# Patient Record
Sex: Female | Born: 1965 | Hispanic: No | Marital: Married | Smoking: Never smoker
Health system: Southern US, Community
[De-identification: ages and names within clinical notes are randomized; demographics above are authoritative.]

## PROBLEM LIST (undated history)

## (undated) DIAGNOSIS — R51 Headache: Secondary | ICD-10-CM

## (undated) DIAGNOSIS — E785 Hyperlipidemia, unspecified: Secondary | ICD-10-CM

## (undated) DIAGNOSIS — Z923 Personal history of irradiation: Secondary | ICD-10-CM

## (undated) DIAGNOSIS — K219 Gastro-esophageal reflux disease without esophagitis: Secondary | ICD-10-CM

## (undated) DIAGNOSIS — C50919 Malignant neoplasm of unspecified site of unspecified female breast: Secondary | ICD-10-CM

## (undated) DIAGNOSIS — Z91018 Allergy to other foods: Secondary | ICD-10-CM

## (undated) DIAGNOSIS — C50211 Malignant neoplasm of upper-inner quadrant of right female breast: Principal | ICD-10-CM

## (undated) HISTORY — DX: Allergy to other foods: Z91.018

## (undated) HISTORY — DX: Malignant neoplasm of unspecified site of unspecified female breast: C50.919

## (undated) HISTORY — DX: Gastro-esophageal reflux disease without esophagitis: K21.9

## (undated) HISTORY — DX: Malignant neoplasm of upper-inner quadrant of right female breast: C50.211

## (undated) HISTORY — DX: Hyperlipidemia, unspecified: E78.5

## (undated) HISTORY — DX: Headache: R51

---

## 1997-08-18 ENCOUNTER — Encounter: Payer: Self-pay | Admitting: Internal Medicine

## 1997-09-03 ENCOUNTER — Encounter (HOSPITAL_COMMUNITY): Admission: RE | Admit: 1997-09-03 | Discharge: 1997-12-02 | Payer: Self-pay | Admitting: Obstetrics and Gynecology

## 1997-10-21 ENCOUNTER — Other Ambulatory Visit: Admission: RE | Admit: 1997-10-21 | Discharge: 1997-10-21 | Payer: Self-pay | Admitting: Obstetrics and Gynecology

## 1998-05-05 ENCOUNTER — Inpatient Hospital Stay (HOSPITAL_COMMUNITY): Admission: AD | Admit: 1998-05-05 | Discharge: 1998-05-10 | Payer: Self-pay | Admitting: Obstetrics and Gynecology

## 1998-05-10 ENCOUNTER — Encounter (HOSPITAL_COMMUNITY): Admission: RE | Admit: 1998-05-10 | Discharge: 1998-08-08 | Payer: Self-pay | Admitting: *Deleted

## 1999-06-30 ENCOUNTER — Other Ambulatory Visit: Admission: RE | Admit: 1999-06-30 | Discharge: 1999-06-30 | Payer: Self-pay | Admitting: Obstetrics and Gynecology

## 2000-10-21 ENCOUNTER — Encounter: Payer: Self-pay | Admitting: Infectious Diseases

## 2000-10-21 ENCOUNTER — Ambulatory Visit (HOSPITAL_COMMUNITY): Admission: RE | Admit: 2000-10-21 | Discharge: 2000-10-21 | Payer: Self-pay | Admitting: Infectious Diseases

## 2000-11-14 ENCOUNTER — Other Ambulatory Visit: Admission: RE | Admit: 2000-11-14 | Discharge: 2000-11-14 | Payer: Self-pay | Admitting: Obstetrics and Gynecology

## 2001-02-20 ENCOUNTER — Encounter: Payer: Self-pay | Admitting: Orthopedic Surgery

## 2001-02-20 ENCOUNTER — Ambulatory Visit (HOSPITAL_COMMUNITY): Admission: RE | Admit: 2001-02-20 | Discharge: 2001-02-20 | Payer: Self-pay | Admitting: Orthopedic Surgery

## 2001-04-22 ENCOUNTER — Encounter: Admission: RE | Admit: 2001-04-22 | Discharge: 2001-07-21 | Payer: Self-pay | Admitting: Internal Medicine

## 2001-06-03 ENCOUNTER — Encounter: Payer: Self-pay | Admitting: Family Medicine

## 2001-06-03 ENCOUNTER — Encounter: Admission: RE | Admit: 2001-06-03 | Discharge: 2001-06-03 | Payer: Self-pay | Admitting: Family Medicine

## 2002-01-16 ENCOUNTER — Other Ambulatory Visit: Admission: RE | Admit: 2002-01-16 | Discharge: 2002-01-16 | Payer: Self-pay | Admitting: Obstetrics and Gynecology

## 2002-01-27 ENCOUNTER — Ambulatory Visit (HOSPITAL_COMMUNITY): Admission: RE | Admit: 2002-01-27 | Discharge: 2002-01-27 | Payer: Self-pay | Admitting: Obstetrics and Gynecology

## 2002-01-27 ENCOUNTER — Encounter: Payer: Self-pay | Admitting: Obstetrics and Gynecology

## 2002-10-08 ENCOUNTER — Encounter: Payer: Self-pay | Admitting: Obstetrics and Gynecology

## 2002-10-08 ENCOUNTER — Inpatient Hospital Stay (HOSPITAL_COMMUNITY): Admission: AD | Admit: 2002-10-08 | Discharge: 2002-10-08 | Payer: Self-pay | Admitting: Obstetrics and Gynecology

## 2002-10-16 ENCOUNTER — Inpatient Hospital Stay (HOSPITAL_COMMUNITY): Admission: AD | Admit: 2002-10-16 | Discharge: 2002-10-16 | Payer: Self-pay | Admitting: Obstetrics and Gynecology

## 2002-11-27 ENCOUNTER — Inpatient Hospital Stay (HOSPITAL_COMMUNITY): Admission: AD | Admit: 2002-11-27 | Discharge: 2002-11-30 | Payer: Self-pay | Admitting: Obstetrics and Gynecology

## 2002-12-31 ENCOUNTER — Other Ambulatory Visit: Admission: RE | Admit: 2002-12-31 | Discharge: 2002-12-31 | Payer: Self-pay | Admitting: Obstetrics and Gynecology

## 2004-07-20 ENCOUNTER — Other Ambulatory Visit: Admission: RE | Admit: 2004-07-20 | Discharge: 2004-07-20 | Payer: Self-pay | Admitting: Obstetrics and Gynecology

## 2004-08-01 ENCOUNTER — Ambulatory Visit: Payer: Self-pay | Admitting: Cardiology

## 2004-11-24 ENCOUNTER — Encounter: Payer: Self-pay | Admitting: Internal Medicine

## 2004-11-26 ENCOUNTER — Encounter: Payer: Self-pay | Admitting: Internal Medicine

## 2007-01-02 ENCOUNTER — Ambulatory Visit: Payer: Self-pay | Admitting: Internal Medicine

## 2007-01-02 ENCOUNTER — Encounter: Payer: Self-pay | Admitting: Internal Medicine

## 2007-01-02 DIAGNOSIS — K219 Gastro-esophageal reflux disease without esophagitis: Secondary | ICD-10-CM | POA: Insufficient documentation

## 2007-01-02 DIAGNOSIS — R0989 Other specified symptoms and signs involving the circulatory and respiratory systems: Secondary | ICD-10-CM

## 2007-01-02 DIAGNOSIS — J309 Allergic rhinitis, unspecified: Secondary | ICD-10-CM | POA: Insufficient documentation

## 2007-01-02 DIAGNOSIS — R0609 Other forms of dyspnea: Secondary | ICD-10-CM | POA: Insufficient documentation

## 2007-01-02 DIAGNOSIS — E785 Hyperlipidemia, unspecified: Secondary | ICD-10-CM | POA: Insufficient documentation

## 2007-01-02 DIAGNOSIS — R4589 Other symptoms and signs involving emotional state: Secondary | ICD-10-CM | POA: Insufficient documentation

## 2007-01-02 LAB — CONVERTED CEMR LAB
AST: 22 units/L (ref 0–37)
Basophils Absolute: 0 10*3/uL (ref 0.0–0.1)
CO2: 27 meq/L (ref 19–32)
Calcium: 9.8 mg/dL (ref 8.4–10.5)
Chloride: 106 meq/L (ref 96–112)
Cholesterol: 174 mg/dL (ref 0–200)
Creatinine, Ser: 0.7 mg/dL (ref 0.4–1.2)
GFR calc Af Amer: 119 mL/min
HCT: 38.6 % (ref 36.0–46.0)
Ketones, ur: NEGATIVE mg/dL
Lymphocytes Relative: 31.5 % (ref 12.0–46.0)
MCHC: 34.7 g/dL (ref 30.0–36.0)
MCV: 91.4 fL (ref 78.0–100.0)
Monocytes Absolute: 0.4 10*3/uL (ref 0.2–0.7)
Monocytes Relative: 6.5 % (ref 3.0–11.0)
Neutro Abs: 3.5 10*3/uL (ref 1.4–7.7)
Platelets: 300 10*3/uL (ref 150–400)
RDW: 12.2 % (ref 11.5–14.6)
Specific Gravity, Urine: >=1.03 (ref 1.000–1.03)
TSH: 1.36 microintl units/mL (ref 0.35–5.50)
Urine Glucose: NEGATIVE mg/dL
Urobilinogen, UA: 0.2 (ref 0.0–1.0)
VLDL: 20 mg/dL (ref 0–40)
pH: 5.5 (ref 5.0–8.0)

## 2007-01-28 ENCOUNTER — Encounter: Admission: RE | Admit: 2007-01-28 | Discharge: 2007-01-28 | Payer: Self-pay | Admitting: Obstetrics and Gynecology

## 2007-03-18 ENCOUNTER — Encounter: Payer: Self-pay | Admitting: Internal Medicine

## 2007-04-15 ENCOUNTER — Encounter: Payer: Self-pay | Admitting: Internal Medicine

## 2007-05-14 ENCOUNTER — Encounter: Payer: Self-pay | Admitting: Internal Medicine

## 2007-06-10 ENCOUNTER — Encounter: Payer: Self-pay | Admitting: Internal Medicine

## 2007-07-21 ENCOUNTER — Ambulatory Visit: Payer: Self-pay | Admitting: Internal Medicine

## 2007-07-21 DIAGNOSIS — R12 Heartburn: Secondary | ICD-10-CM | POA: Insufficient documentation

## 2007-07-21 DIAGNOSIS — R141 Gas pain: Secondary | ICD-10-CM | POA: Insufficient documentation

## 2007-07-21 DIAGNOSIS — R1031 Right lower quadrant pain: Secondary | ICD-10-CM | POA: Insufficient documentation

## 2007-07-21 DIAGNOSIS — R142 Eructation: Secondary | ICD-10-CM

## 2007-07-21 DIAGNOSIS — R143 Flatulence: Secondary | ICD-10-CM

## 2007-07-30 ENCOUNTER — Encounter: Admission: RE | Admit: 2007-07-30 | Discharge: 2007-09-02 | Payer: Self-pay | Admitting: Occupational Medicine

## 2007-08-05 ENCOUNTER — Telehealth: Payer: Self-pay | Admitting: Internal Medicine

## 2007-08-08 ENCOUNTER — Telehealth (INDEPENDENT_AMBULATORY_CARE_PROVIDER_SITE_OTHER): Payer: Self-pay

## 2007-08-08 ENCOUNTER — Ambulatory Visit (HOSPITAL_COMMUNITY): Admission: RE | Admit: 2007-08-08 | Discharge: 2007-08-08 | Payer: Self-pay | Admitting: Internal Medicine

## 2007-08-11 ENCOUNTER — Ambulatory Visit (HOSPITAL_COMMUNITY): Admission: RE | Admit: 2007-08-11 | Discharge: 2007-08-11 | Payer: Self-pay | Admitting: Internal Medicine

## 2007-08-15 ENCOUNTER — Telehealth: Payer: Self-pay | Admitting: Internal Medicine

## 2007-08-26 ENCOUNTER — Encounter: Payer: Self-pay | Admitting: Internal Medicine

## 2007-12-15 ENCOUNTER — Telehealth: Payer: Self-pay | Admitting: Internal Medicine

## 2008-03-19 ENCOUNTER — Encounter: Admission: RE | Admit: 2008-03-19 | Discharge: 2008-03-19 | Payer: Self-pay | Admitting: Obstetrics and Gynecology

## 2008-06-21 ENCOUNTER — Ambulatory Visit: Payer: Self-pay | Admitting: Surgery

## 2009-10-24 ENCOUNTER — Encounter: Admission: RE | Admit: 2009-10-24 | Discharge: 2009-10-24 | Payer: Self-pay | Admitting: Chiropractic Medicine

## 2009-12-14 ENCOUNTER — Ambulatory Visit (HOSPITAL_COMMUNITY): Admission: RE | Admit: 2009-12-14 | Discharge: 2009-12-14 | Payer: Self-pay | Admitting: Orthopedic Surgery

## 2010-01-10 ENCOUNTER — Encounter
Admission: RE | Admit: 2010-01-10 | Discharge: 2010-02-09 | Payer: Self-pay | Source: Home / Self Care | Attending: Orthopedic Surgery | Admitting: Orthopedic Surgery

## 2010-03-26 ENCOUNTER — Encounter: Payer: Self-pay | Admitting: Internal Medicine

## 2010-03-26 ENCOUNTER — Encounter: Payer: Self-pay | Admitting: Chiropractic Medicine

## 2010-07-14 ENCOUNTER — Ambulatory Visit: Payer: Self-pay | Admitting: Internal Medicine

## 2010-07-14 ENCOUNTER — Encounter: Payer: Self-pay | Admitting: Gastroenterology

## 2010-07-18 NOTE — Assessment & Plan Note (Signed)
OFFICE VISIT   Taylor Zamora, Taylor Zamora  DOB:  October 14, 1965                                       06/21/2008  ZOXWR#:[id number]   REASON FOR VISIT:  Leg pain and swelling.   HISTORY:  This is a 45 year old female I am seeing at the request of Dr.  Henderson Cloud for evaluation of leg pain and swelling.  The patient states  that she has been having pain and swelling in her legs, right greater  than left, for many years which has progressively gotten worse.  She  describes this as having a numbness in her feet which is like her feet  have gone to sleep.  The most significant aspect of pain that she has is  in her right knee.  She states that the pain is worse when she stands up  but gets better the further she walks.  She denies having any  significant bulging veins.  She says that her edema is mild.  She did  not endorse symptoms of claudication.   PAST MEDICAL HISTORY:  None.   PAST SURGICAL HISTORY:  C-section.   SOCIAL HISTORY:  She is married with 2 children, does not smoke, has  never smoked, does not drink.   REVIEW OF SYSTEMS:  GENERAL:  Negative for fevers, chills, weight gain,  weight loss.  CARDIAC:  Positive for shortness of breath with exertion.  PULMONARY:  Negative.  GI:  Negative.  GU:  Negative.  NEURO:  Negative.  ORTHO:  Positive for joint pain.  PSYCH:  Negative.  ENT:  Negative.  HEME:  Negative.   MEDICATIONS:  Include allergy medicines.   ALLERGY:  Aspirin.   PHYSICAL EXAM:  Blood pressure is 124/78, pulse 58.  General:  She is  well-appearing, in no distress.  HEENT:  Normocephalic, atraumatic.  Pupils equal.  Cardiovascular:  Regular rate and rhythm, respirations  nonlabored.  Extremities:  Warm and well-perfused.  She does have mild  spider telangiectasias.  There are no large varicosities.  She has  palpable pedal pulses, edema is minimal.   DIAGNOSTIC STUDIES:  Venous ultrasound was performed today which was  negative for  DVT.   ASSESSMENT/PLAN:  Bilateral leg pain and swelling.   Plan:  I do not feel that the patient's symptoms are primarily venous in  origin.  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  She also does have  a significant amount of swelling in her legs.  There are some spider  telangiectasias that could be candidates for sclerotherapy; however,  this is a purely cosmetic situation for her.  I will have her scheduled  to come back to see me on a p.r.n. basis.  I told her if her swelling  gets worse that we could potentially try compression therapy and/or  getting an ultrasound to look for venous reflux.   Jorge Ny, MD  Electronically Signed   VWB/MEDQ  D:  06/21/2008  T:  06/22/2008  Job:  [id number]   cc:   Guy Sandifer. Henderson Cloud, M.D.

## 2010-07-18 NOTE — Procedures (Signed)
DUPLEX DEEP VENOUS EXAM - LOWER EXTREMITY   INDICATION:  Bilateral lower extremity swelling.   HISTORY:  Edema:  No.  Trauma/Surgery:  No.  Pain:  Yes.  PE:  No.  Previous DVT:  No.  Anticoagulants:  No.  Other:   DUPLEX EXAM:                CFV   SFV   PopV  PTV    GSV                R  L  R  L  R  L  R   L  R  L  Thrombosis    o  o  o  o  o  o  o   o  o  o  Spontaneous   +  +  +  +  +  +  +   +  +  +  Phasic        +  +  +  +  +  +  +   +  +  +  Augmentation  +  +  +  +  +  +  +   +  +  +  Compressible  +  +  +  +  +  +  +   +  +  +  Competent     +  +  +  +  +  +  +   +  +  +   Legend:  + - yes  o - no  p - partial  D - decreased   IMPRESSION:  Duplex shows no evidence of deep or superficial venous  thrombosis.    _____________________________  V. Charlena Cross, MD   AC/MEDQ  D:  06/21/2008  T:  06/21/2008  Job:  [id number]

## 2010-07-21 NOTE — Discharge Summary (Signed)
NAME:  Taylor Zamora, Taylor Zamora                     ACCOUNT NO.:  [id number]   MEDICAL RECORD NO.:  [id number]                   PATIENT TYPE:  INP   LOCATION:  9142                                 FACILITY:  [facility]   PHYSICIAN:  Guy Sandifer. Henderson Cloud, M.D.              DATE OF BIRTH:  June 27, 1965   DATE OF ADMISSION:  11/27/2002  DATE OF DISCHARGE:  11/30/2002                                 DISCHARGE SUMMARY   ADMITTING DIAGNOSES:  1. Intrauterine pregnancy at 106 and five-sevenths weeks estimated     gestational age.  2. Labor.  3. Previous cesarean delivery.   DISCHARGE DIAGNOSES:  1. Status post low transverse cesarean section.  2. Viable female infant.   PROCEDURE:  1. Repeat low transverse cesarean section.  2. Lysis of adhesions.   REASON FOR ADMISSION:  Please see written H&P.   HOSPITAL COURSE:  The patient was a 45 year old gravida 4 para 1 that  presented to [facility] in labor.  The patient had had a  previous cesarean and was scheduled for a repeat cesarean delivery.  The  patient was taken to the operating room where spinal anesthesia was  administered without difficulty.  A low transverse incision was made with  the delivery of a viable female infant weighing 7 pounds 6 ounces with Apgars  of 8 at one minute and 10 at five minutes.  There were numerous adhesions of  the uterus to the anterior abdominal wall and these were all taken down with  sharp and blunt dissection.  The patient tolerated the procedure well and  was transferred to the recovery room in stable condition.  On postoperative  day #1 vital signs were stable; she was afebrile.  Abdomen was soft with  good return of bowel function.  Fundus was firm and nontender.  Abdominal  dressing was clean, dry, and intact.  Labs revealed hemoglobin of 10.9.  On  postoperative day #2 the patient was doing well.  Vital signs were stable;  she remained afebrile.  Fundus was firm and nontender.  Incision was  clean,  dry, and intact.  The patient was ambulating well and tolerating a regular  diet without complaints of nausea and vomiting.  On postoperative day #3 the  patient was without complaint.  Vital signs were stable; she remained  afebrile.  Abdomen was soft.  Fundus was firm at a -1 station, slightly  tender to palpation.  Incision was noted to have some small oozing noted.  Staples were intact.  No seroma or erythema was noted.  Area was cleaned  with Betadine and Steri-Strips were applied.  Staples were not removed.  Labs revealed hemoglobin of 10.9, wbc count of 11.1.  Instructions were  given and the patient was discharged home.   CONDITION ON DISCHARGE:  Good.   DIET:  Regular as tolerated.   ACTIVITY:  No heavy lifting, no driving x2 weeks, no vaginal entry.  FOLLOW-UP:  The patient is to follow up in the office in two days for an  incision check and staple removal.  She is to call for temperature greater  than 100 degrees, persistent nausea and vomiting, heavy vaginal bleeding,  and/or redness or drainage from the incisional site.   DISCHARGE MEDICATIONS:  1. Percocet 5/325 #30 one p.o. q.4-6h. p.r.n.  2. Motrin 600 mg q.6h.  3. Augmentin 875 mg one p.o. b.i.d.  4. Prenatal vitamins one p.o. daily.  5. Colace one p.o. daily p.r.n.     Julio Sicks, N.P.                        Guy Sandifer. Henderson Cloud, M.D.    CC/MEDQ  D:  12/22/2002  T:  12/22/2002  Job:  [id number]

## 2010-07-21 NOTE — Op Note (Signed)
NAME:  Taylor Zamora, Taylor Zamora                     ACCOUNT NO.:  [id number]   MEDICAL RECORD NO.:  [id number]                   PATIENT TYPE:  INP   LOCATION:  9142                                 FACILITY:  [facility]   PHYSICIAN:  Michelle L. Vincente Poli, M.D.            DATE OF BIRTH:  [birth year]   DATE OF PROCEDURE:  11/27/2002  DATE OF DISCHARGE:                                 OPERATIVE REPORT   PREOPERATIVE DIAGNOSES:  1. Intrauterine pregnancy at term.  2. Labor.  3. Previous cesarean section.   POSTOPERATIVE DIAGNOSES:  1. Intrauterine pregnancy at term.  2. Labor.  3. Previous cesarean section.   PROCEDURES:  1. Repeat low transverse cesarean section.  2. Lysis of adhesions.   SURGEON:  Michelle L. Vincente Poli, M.D.   ESTIMATED BLOOD LOSS:  500 mL.   ANESTHESIA:  Spinal.   FINDINGS:  Female infant in cephalic presentation, Apgars 8 at one minute and  9 at five minutes.   PROCEDURE:  The patient was taken to the operating room where she was given  her spinal without incident and then she was prepped and draped in the usual  sterile fashion.  A Foley catheter was inserted into the bladder.  Using a  scalpel a low transverse incision was made in the area of a the previous C-  section and carried down to the fascia.  The fascia was scored in the  midline and extended laterally.  The Pfannenstiel incision was then  developed, the rectus muscles were separated in the midline.  There were  noted to be numerous adhesions, the uterus to the anterior abdominal wall  and these were then taken down with sharp and blunt dissection.  The bladder  blade was inserted.  The lower uterine segment was identified and the  bladder flap was created sharply and then digitally.  The low transverse  incision was made in the uterus and was extended laterally.  The baby was in  cephalic presentation.  Amniotic fluid was noted to be clear.  The baby was  a female infant with Apgars 8 at one minute and 9 at  five minutes.  The cord  was clamped and cut.  The baby was handed to the waiting pediatricians and  was subsequently taken to the newborn nursery.  The placenta was manually  removed and noted to be normal and intact and was removed in its entirety.  The uterus was cleared of all clots and debris.  The uterine incision was  closed in a single layer using 0 chromic in continuous running-lock stitch.  It was hemostatic.  Adhesions to the anterior abdominal wall were then taken  down with sharp dissection using Metzenbaum with good hemostasis.  The  peritoneum was closed using 0 Vicryl in a continuous running stitch and the  rectus muscles were reapproximated using the same 0 Vicryl.  The fascia was  closed using 0 Vicryl in continuous running stitch starting  at each corner  and meeting in the  midline.  After inspection of the subcutaneous layer, the skin was closed  with staples.  All sponge, lap, and instrument counts were correct x2.  The  patient tolerated the procedure well and went to recovery room in stable  condition.                                               Michelle L. Vincente Poli, M.D.    Florestine Avers  D:  11/27/2002  T:  11/28/2002  Job:  [id number]

## 2010-08-11 ENCOUNTER — Encounter: Payer: Self-pay | Admitting: Internal Medicine

## 2010-09-22 ENCOUNTER — Encounter: Payer: Self-pay | Admitting: Internal Medicine

## 2010-09-22 ENCOUNTER — Ambulatory Visit (INDEPENDENT_AMBULATORY_CARE_PROVIDER_SITE_OTHER): Payer: Commercial Managed Care - PPO | Admitting: Internal Medicine

## 2010-09-22 VITALS — BP 100/58 | HR 60 | Ht 59.0 in | Wt 123.4 lb

## 2010-09-22 DIAGNOSIS — R143 Flatulence: Secondary | ICD-10-CM

## 2010-09-22 DIAGNOSIS — Z9109 Other allergy status, other than to drugs and biological substances: Secondary | ICD-10-CM

## 2010-09-22 DIAGNOSIS — R141 Gas pain: Secondary | ICD-10-CM

## 2010-09-22 DIAGNOSIS — Z889 Allergy status to unspecified drugs, medicaments and biological substances status: Secondary | ICD-10-CM

## 2010-09-22 DIAGNOSIS — R142 Eructation: Secondary | ICD-10-CM

## 2010-09-22 NOTE — Assessment & Plan Note (Addendum)
Chronic issues with this. Same symptoms as in 2009. No weight loss or other worrisome features.Some early satiety. Sounds like a functional problem like poor gastric accomodation, functional dyspepsia. Offered buspirone therapy - she declined but will consider and contact us if she wants to try.

## 2010-09-22 NOTE — Progress Notes (Signed)
  Subjective:    Patient ID: Taylor Zamora, female    DOB: 1965-09-29, 45 y.o.   MRN: [id number]  HPI Full and bloated just like on 2009. No pain. No problems with food. No significant heartburn. BM's regular. Drinks a lot of hot tea. Adequate fluid intake. She eats what she is reportedly allergic to without problems. +++ early satiety. No nausea and vomiting. No sig belching or gas.   Review of Systems     Objective:   Physical Exam Thin NAD Lungs cta Heart s1 s2  abd soft and nontender, slighty protuberant       Assessment & Plan:  I advised that she did not need a "fat burning drug". She is concerned with loose skin in abdominal wall also - to seek plastic surgery evaluation. See other assessment and plans also.

## 2010-09-22 NOTE — Patient Instructions (Signed)
If you decide you would like to try the buspirone to help your symptoms please call us back and we can prescribe it.

## 2010-09-24 DIAGNOSIS — Z889 Allergy status to unspecified drugs, medicaments and biological substances status: Secondary | ICD-10-CM | POA: Insufficient documentation

## 2010-09-24 NOTE — Assessment & Plan Note (Signed)
History of food allergies but she says she is able to eat those foods without problems and has bloating constantly so doubt this is related to GI symptms.

## 2011-07-31 ENCOUNTER — Other Ambulatory Visit (HOSPITAL_COMMUNITY)
Admission: RE | Admit: 2011-07-31 | Discharge: 2011-07-31 | Disposition: A | Discharge status: Home / Self Care | Payer: 59 | Source: Ambulatory Visit | Attending: Family Medicine | Admitting: Family Medicine

## 2011-07-31 ENCOUNTER — Other Ambulatory Visit: Payer: Self-pay | Admitting: Family Medicine

## 2011-07-31 DIAGNOSIS — Z01419 Encounter for gynecological examination (general) (routine) without abnormal findings: Secondary | ICD-10-CM | POA: Insufficient documentation

## 2011-08-02 ENCOUNTER — Other Ambulatory Visit: Payer: Self-pay | Admitting: Family Medicine

## 2011-08-02 DIAGNOSIS — N644 Mastodynia: Secondary | ICD-10-CM

## 2011-08-14 ENCOUNTER — Ambulatory Visit
Admission: RE | Admit: 2011-08-14 | Discharge: 2011-08-14 | Disposition: A | Discharge status: Home / Self Care | Payer: Commercial Managed Care - PPO | Source: Ambulatory Visit | Attending: Family Medicine | Admitting: Family Medicine

## 2011-08-14 DIAGNOSIS — N644 Mastodynia: Secondary | ICD-10-CM

## 2013-11-19 ENCOUNTER — Other Ambulatory Visit: Payer: Self-pay | Admitting: Family Medicine

## 2013-11-19 ENCOUNTER — Other Ambulatory Visit (HOSPITAL_COMMUNITY)
Admission: RE | Admit: 2013-11-19 | Discharge: 2013-11-19 | Disposition: A | Discharge status: Home / Self Care | Payer: BC Managed Care – PPO | Source: Ambulatory Visit | Attending: Family Medicine | Admitting: Family Medicine

## 2013-11-19 DIAGNOSIS — Z0000 Encounter for general adult medical examination without abnormal findings: Secondary | ICD-10-CM | POA: Insufficient documentation

## 2013-11-20 ENCOUNTER — Other Ambulatory Visit: Payer: Self-pay | Admitting: Family Medicine

## 2013-11-20 DIAGNOSIS — Z1231 Encounter for screening mammogram for malignant neoplasm of breast: Secondary | ICD-10-CM

## 2013-11-23 ENCOUNTER — Ambulatory Visit
Admission: RE | Admit: 2013-11-23 | Discharge: 2013-11-23 | Disposition: A | Discharge status: Home / Self Care | Payer: BC Managed Care – PPO | Source: Ambulatory Visit | Attending: Family Medicine | Admitting: Family Medicine

## 2013-11-23 DIAGNOSIS — Z1231 Encounter for screening mammogram for malignant neoplasm of breast: Secondary | ICD-10-CM

## 2013-11-23 LAB — CYTOLOGY - PAP

## 2015-01-19 ENCOUNTER — Other Ambulatory Visit (HOSPITAL_COMMUNITY)
Admission: RE | Admit: 2015-01-19 | Discharge: 2015-01-19 | Disposition: A | Discharge status: Home / Self Care | Payer: BLUE CROSS/BLUE SHIELD | Source: Ambulatory Visit | Attending: Family Medicine | Admitting: Family Medicine

## 2015-01-19 ENCOUNTER — Other Ambulatory Visit: Payer: Self-pay | Admitting: Family Medicine

## 2015-01-19 DIAGNOSIS — Z01419 Encounter for gynecological examination (general) (routine) without abnormal findings: Secondary | ICD-10-CM | POA: Diagnosis present

## 2015-01-21 ENCOUNTER — Other Ambulatory Visit: Payer: Self-pay

## 2015-01-21 DIAGNOSIS — Z1231 Encounter for screening mammogram for malignant neoplasm of breast: Secondary | ICD-10-CM

## 2015-01-24 LAB — CYTOLOGY - PAP

## 2015-02-16 ENCOUNTER — Ambulatory Visit
Admission: RE | Admit: 2015-02-16 | Discharge: 2015-02-16 | Disposition: A | Discharge status: Home / Self Care | Payer: Self-pay | Source: Ambulatory Visit

## 2015-02-16 DIAGNOSIS — Z1231 Encounter for screening mammogram for malignant neoplasm of breast: Secondary | ICD-10-CM

## 2015-02-17 ENCOUNTER — Other Ambulatory Visit: Payer: Self-pay | Admitting: Family Medicine

## 2015-02-17 DIAGNOSIS — R928 Other abnormal and inconclusive findings on diagnostic imaging of breast: Secondary | ICD-10-CM

## 2015-02-23 ENCOUNTER — Ambulatory Visit
Admission: RE | Admit: 2015-02-23 | Discharge: 2015-02-23 | Disposition: A | Discharge status: Home / Self Care | Payer: No Typology Code available for payment source | Source: Ambulatory Visit | Attending: Family Medicine | Admitting: Family Medicine

## 2015-02-23 ENCOUNTER — Other Ambulatory Visit: Payer: Self-pay | Admitting: Family Medicine

## 2015-02-23 DIAGNOSIS — R928 Other abnormal and inconclusive findings on diagnostic imaging of breast: Secondary | ICD-10-CM

## 2015-03-06 DIAGNOSIS — Z923 Personal history of irradiation: Secondary | ICD-10-CM

## 2015-03-06 HISTORY — DX: Personal history of irradiation: Z92.3

## 2015-03-14 ENCOUNTER — Other Ambulatory Visit: Payer: Self-pay | Admitting: Family Medicine

## 2015-03-14 DIAGNOSIS — R928 Other abnormal and inconclusive findings on diagnostic imaging of breast: Secondary | ICD-10-CM

## 2015-03-18 ENCOUNTER — Inpatient Hospital Stay: Admission: RE | Admit: 2015-03-18 | Payer: No Typology Code available for payment source | Source: Ambulatory Visit

## 2015-04-29 ENCOUNTER — Ambulatory Visit
Admission: RE | Admit: 2015-04-29 | Discharge: 2015-04-29 | Disposition: A | Discharge status: Home / Self Care | Payer: BLUE CROSS/BLUE SHIELD | Source: Ambulatory Visit | Attending: Family Medicine | Admitting: Family Medicine

## 2015-04-29 DIAGNOSIS — R928 Other abnormal and inconclusive findings on diagnostic imaging of breast: Secondary | ICD-10-CM

## 2015-05-03 ENCOUNTER — Encounter: Payer: Self-pay | Admitting: *Deleted

## 2015-05-03 ENCOUNTER — Telehealth: Payer: Self-pay | Admitting: *Deleted

## 2015-05-03 DIAGNOSIS — C50211 Malignant neoplasm of upper-inner quadrant of right female breast: Secondary | ICD-10-CM

## 2015-05-03 DIAGNOSIS — Z170 Estrogen receptor positive status [ER+]: Secondary | ICD-10-CM

## 2015-05-03 HISTORY — DX: Malignant neoplasm of upper-inner quadrant of right female breast: C50.211

## 2015-05-03 NOTE — Telephone Encounter (Signed)
Confirmed [facility] for 05/11/15 at 0815.  Instructions and contact information given.

## 2015-05-03 NOTE — Telephone Encounter (Signed)
Mailed clinic packet to pt.  

## 2015-05-11 ENCOUNTER — Ambulatory Visit
Admission: RE | Admit: 2015-05-11 | Discharge: 2015-05-11 | Disposition: A | Discharge status: Home / Self Care | Payer: BLUE CROSS/BLUE SHIELD | Source: Ambulatory Visit | Attending: Radiation Oncology | Admitting: Radiation Oncology

## 2015-05-11 ENCOUNTER — Encounter: Payer: Self-pay | Admitting: *Deleted

## 2015-05-11 ENCOUNTER — Encounter: Payer: Self-pay | Admitting: Nurse Practitioner

## 2015-05-11 ENCOUNTER — Ambulatory Visit: Payer: Self-pay | Admitting: Surgery

## 2015-05-11 ENCOUNTER — Telehealth: Payer: Self-pay | Admitting: Oncology

## 2015-05-11 ENCOUNTER — Encounter: Payer: Self-pay | Admitting: Skilled Nursing Facility1

## 2015-05-11 ENCOUNTER — Other Ambulatory Visit (HOSPITAL_BASED_OUTPATIENT_CLINIC_OR_DEPARTMENT_OTHER): Payer: BLUE CROSS/BLUE SHIELD

## 2015-05-11 ENCOUNTER — Encounter: Payer: Self-pay | Admitting: Oncology

## 2015-05-11 ENCOUNTER — Encounter: Payer: Self-pay | Admitting: Physical Therapy

## 2015-05-11 ENCOUNTER — Ambulatory Visit: Payer: BLUE CROSS/BLUE SHIELD | Attending: Surgery | Admitting: Physical Therapy

## 2015-05-11 ENCOUNTER — Ambulatory Visit (HOSPITAL_BASED_OUTPATIENT_CLINIC_OR_DEPARTMENT_OTHER): Payer: BLUE CROSS/BLUE SHIELD | Admitting: Oncology

## 2015-05-11 VITALS — BP 109/58 | HR 60 | Temp 98.0°F | Resp 18 | Ht 59.0 in | Wt 133.1 lb

## 2015-05-11 DIAGNOSIS — C50211 Malignant neoplasm of upper-inner quadrant of right female breast: Secondary | ICD-10-CM

## 2015-05-11 DIAGNOSIS — Z170 Estrogen receptor positive status [ER+]: Secondary | ICD-10-CM

## 2015-05-11 DIAGNOSIS — C50411 Malignant neoplasm of upper-outer quadrant of right female breast: Secondary | ICD-10-CM | POA: Diagnosis present

## 2015-05-11 DIAGNOSIS — C50911 Malignant neoplasm of unspecified site of right female breast: Secondary | ICD-10-CM

## 2015-05-11 LAB — CBC WITH DIFFERENTIAL/PLATELET
BASO%: 0.8 % (ref 0.0–2.0)
BASOS ABS: 0 10*3/uL (ref 0.0–0.1)
EOS ABS: 0.3 10*3/uL (ref 0.0–0.5)
EOS%: 5.9 % (ref 0.0–7.0)
HCT: 37.5 % (ref 34.8–46.6)
HGB: 13 g/dL (ref 11.6–15.9)
LYMPH%: 41.3 % (ref 14.0–49.7)
MCH: 30.8 pg (ref 25.1–34.0)
MCHC: 34.7 g/dL (ref 31.5–36.0)
MCV: 88.9 fL (ref 79.5–101.0)
MONO#: 0.4 10*3/uL (ref 0.1–0.9)
MONO%: 6.8 % (ref 0.0–14.0)
NEUT#: 2.4 10*3/uL (ref 1.5–6.5)
NEUT%: 45.2 % (ref 38.4–76.8)
Platelets: 270 10*3/uL (ref 145–400)
RBC: 4.22 10*6/uL (ref 3.70–5.45)
RDW: 12.6 % (ref 11.2–14.5)
WBC: 5.3 10*3/uL (ref 3.9–10.3)
lymph#: 2.2 10*3/uL (ref 0.9–3.3)

## 2015-05-11 LAB — COMPREHENSIVE METABOLIC PANEL
ALBUMIN: 3.9 g/dL (ref 3.5–5.0)
ALK PHOS: 52 U/L (ref 40–150)
ALT: 13 U/L (ref 0–55)
AST: 17 U/L (ref 5–34)
Anion Gap: 8 mEq/L (ref 3–11)
BUN: 16.6 mg/dL (ref 7.0–26.0)
CO2: 25 mEq/L (ref 22–29)
Calcium: 9 mg/dL (ref 8.4–10.4)
Chloride: 108 mEq/L (ref 98–109)
Creatinine: 0.7 mg/dL (ref 0.6–1.1)
GLUCOSE: 87 mg/dL (ref 70–140)
POTASSIUM: 3.5 meq/L (ref 3.5–5.1)
SODIUM: 140 meq/L (ref 136–145)
Total Bilirubin: 0.41 mg/dL (ref 0.20–1.20)
Total Protein: 7.4 g/dL (ref 6.4–8.3)

## 2015-05-11 NOTE — Therapy (Signed)
[place], [place], [address] Phone: [phone]   Fax:  [phone]  Physical Therapy Evaluation  Patient Details  Name: Taylor Zamora MRN: [id number] Date of Birth: [birth year] Referring Provider: Dr. Alphonsa Overall  Encounter Date: 05/11/2015      PT End of Session - 05/11/15 1525    Visit Number 1   Number of Visits 1   PT Start Time 1120   PT Stop Time 1152   PT Time Calculation (min) 32 min   Activity Tolerance Patient tolerated treatment well   Behavior During Therapy [facility] for tasks assessed/performed      Past Medical History  Diagnosis Date  . Allergic rhinitis     hay fever  . GERD (gastroesophageal reflux disease)   . Hyperlipidemia   . Food allergy     carrots, celery, kiwi, apple, peanuts  . Heart murmur   . Headache(784.0)   . Breast cancer of upper-inner quadrant of right female breast ([place]) 05/03/2015  . Breast cancer [facility])     Past Surgical History  Procedure Laterality Date  . Cesarean section  2000,2004    x 2    There were no vitals filed for this visit.  Visit Diagnosis:  Carcinoma of upper-outer quadrant of right female breast [facility]) - Plan: PT plan of care cert/re-cert      Subjective Assessment - 05/11/15 1526    Subjective Patient reports she was diagnosed with right breast cancer.  She was seen today for a baseline assessment of her newly diagnosed right breast cancer.   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  It measures 7 mm and is located in the upper outer quadrant with a Ki67 of 5%.   Patient Stated Goals Reduce lymphedema risk and learn post op shoulder ROM HEP            [facility] PT Assessment - 05/11/15 0001    Assessment   Medical Diagnosis Right breast cancer   Referring Provider Dr. Alphonsa Overall   Onset Date/Surgical Date 02/16/16   Hand Dominance Right   Prior Therapy none   Precautions   Precautions Other (comment)   Precaution Comments active breast cancer   Restrictions   Weight Bearing Restrictions No   Balance Screen   Has the patient fallen in the past 6 months No   Has the patient had a decrease in activity level because of a fear of falling?  No   Is the patient reluctant to leave their home because of a fear of falling?  No   Home Ec[employer] residence   Living Arrangements Spouse/significant other;Children  Lives with husband and 10 and 68 y.o. 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   Leisure She does not exercise   Cognition   Overall Cognitive Status Within Functional Limits for tasks assessed   Posture/Postural Control   Posture/Postural Control No significant limitations   ROM / Strength   AROM / PROM / Strength AROM;Strength   AROM   AROM Assessment Site Shoulder   Right/Left Shoulder Right;Left   Right Shoulder Extension 54 Degrees   Right Shoulder Flexion 157 Degrees   Right Shoulder ABduction 171 Degrees   Right Shoulder Internal Rotation 72 Degrees  Right Shoulder External Rotation 77 Degrees   Left Shoulder Extension 54 Degrees   Left Shoulder Flexion 155 Degrees   Left Shoulder ABduction 161 Degrees   Left Shoulder Internal Rotation 63 Degrees   Left Shoulder External Rotation 83 Degrees   Strength   Overall Strength Within functional limits for tasks performed           LYMPHEDEMA/ONCOLOGY QUESTIONNAIRE - 05/11/15 1500    Type   Cancer Type Right breast cancer   Lymphedema Assessments   Lymphedema Assessments Upper extremities   Right Upper Extremity Lymphedema   10 cm Proximal to Olecranon Process 27.3 cm   Olecranon Process 22.2 cm   10 cm Proximal to Ulnar Styloid Process 21.3 cm   Just Proximal to Ulnar Styloid  Process 13.9 cm   Across Hand at [employer] 16.9 cm   At [place] of 2nd Digit 5.5 cm   Left Upper Extremity Lymphedema   10 cm Proximal to Olecranon Process 27.3 cm   Olecranon Process 22.5 cm   10 cm Proximal to Ulnar Styloid Process 20.8 cm   Just Proximal to Ulnar Styloid Process 13.6 cm   Across Hand at [employer] 17.1 cm   At [place] of 2nd Digit 5.2 cm      Patient was instructed today in a home exercise program today for post op shoulder range of motion. These included active assist shoulder flexion in sitting, scapular retraction, wall walking with shoulder abduction, and hands behind head external rotation.  She was encouraged to do these twice a day, holding 3 seconds and repeating 5 times when permitted by her physician.           PT Education - 05/11/15 1524    Education provided Yes   Education Details Lymphedema risk reduction and post op shoulder ROM HEP   Person(s) Educated Patient;Spouse   Methods Explanation;Demonstration;Handout   Comprehension Returned demonstration;Verbalized understanding              Breast Clinic Goals - 05/11/15 1528    Patient will be able to verbalize understanding of pertinent lymphedema risk reduction practices relevant to her diagnosis specifically related to skin care.   Time 1   Period Days   Status Achieved   Patient will be able to return demonstrate and/or verbalize understanding of the post-op home exercise program related to regaining shoulder range of motion.   Time 1   Period Days   Status Achieved   Patient will be able to verbalize understanding of the importance of attending the postoperative After Breast Cancer Class for further lymphedema risk reduction education and therapeutic exercise.   Time 1   Period Days   Status Achieved              Plan - 05/11/15 1526    Clinical Impression Statement Patient was diagnosed on 02/16/16 with right invasive ductal carcinoma breast cancer which is ER/PR  positive and HER2 negative.  It measures 7 mm and is located in the upper outer quadrant with a Ki67 of 5%.  She is planning to have a right lumpectomy with a sentinel node biopsy followed by Oncotype testing, radiation, and anti-estrogen therapy.  She may benefit from post op PT to regain shoulder ROM and reduce lymphedema risk.  Recommended they discuss their concerns about her anxiety and "marital problems" with the social worker who may be able to provide counseling or refer her to a counselor.   Pt will benefit from skilled  therapeutic intervention in order to improve on the following deficits Decreased strength;Decreased knowledge of precautions;Pain;Impaired UE functional use;Decreased range of motion   Rehab Potential Excellent   Clinical Impairments Affecting Rehab Potential none   PT Frequency One time visit   PT Treatment/Interventions Therapeutic exercise;Patient/family education   PT Next Visit Plan F/u after surgery   PT Home Exercise Plan Shoulder ROM post op HEP   Consulted and Agree with Plan of Care Patient       Patient will follow up at outpatient cancer rehab if needed following surgery.  If the patient requires physical therapy at that time, a specific plan will be dictated and sent to the referring physician for approval. The patient was educated today on appropriate basic range of motion exercises to begin post operatively and the importance of attending the After Breast Cancer class following surgery.  Patient was educated today on lymphedema risk reduction practices as it pertains to recommendations that will benefit the patient immediately following surgery.  She verbalized good understanding.  No additional physical therapy is indicated at this time.      Problem List Patient Active Problem List   Diagnosis Date Noted  . Breast cancer of upper-inner quadrant of right female breast ([place]) 05/03/2015  . Multiple allergies 09/24/2010  . ABDOMINAL BLOATING 07/21/2007  .  HYPERLIPIDEMIA 01/02/2007  . ALLERGIC RHINITIS 01/02/2007  . GERD 01/02/2007    Annia Friendly, PT 05/11/2015 3:31 PM  [place], [place], [address] Phone: [phone]   Fax:  [phone]  Name: Taylor Zamora MRN: [id number] Date of Birth: [birth year]

## 2015-05-11 NOTE — Progress Notes (Signed)
[place]  [phone] [phone]     ID: Taylor Zamora DOB: Nov 15, 1965  MR#: [id number]  ZJQ#:[id number]  Patient Care Team: Antony Blackbird, MD as PCP - General (Family Medicine) Alphonsa Overall, MD as Consulting Physician (General Surgery) Chauncey Cruel, MD as Consulting Physician (Oncology) Sylvan Cheese, NP as Nurse Practitioner (Hematology and Oncology) Everlene Farrier, MD as Consulting Physician (Obstetrics and Gynecology) PCP: Antony Blackbird, MD OTHER MD:  CHIEF COMPLAINT: Estrogen receptor positive breast cancer  CURRENT TREATMENT: Awaiting definitive surgery   BREAST CANCER HISTORY: "Taylor Zamora" had bilateral screening mammography at the [facility] 02/16/2015. This suggested a possible asymmetry in the right breast. On 02/23/2015 the patient underwent right diagnostic mammography with tomosynthesis and right breast ultrasonography. The breast density was category C. In the upper right breast there was an area of distortion which was palpable at approximately 12:30 o'clock. Ultrasound found an ill-defined mass measuring 8 mm. Ultrasound of the right axilla was benign.  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  Her subsequent history is as detailed below  INTERVAL HISTORY: Taylor Zamora was evaluated in the multidisciplinary breast cancer clinic 05/11/2015 accompanied by her husband Taylor Zamora. Her case was also presented in the multidisciplinary breast cancer conference that same morning. At that time a preliminary plan was proposed: Breast conserving surgery, with Oncotype DX sent from the definitive surgical plan, then adjuvant radiation and  anti-estrogens. Genetics was also suggested.  REVIEW OF SYSTEMS: There were no symptoms leading to the screening mammogram, which was routinely scheduled, but the patient has multiple symptoms. She complains of night sweats although not hot flashes. She sleeps poorly. She is fatigued and this does affect her activities. She has frequent headaches which she describes as throbbing and aching. Sometimes she has blurred vision. She is having sinus problems at present. She gets palpitations when stressed and can have chest pain at the same time. She is short of breath at rest sometimes other times when walking and specifically when walking upstairs. She has heartburn problems. He has breast tenderness frequently. She feels weak and numb at times. A detailed review of systems today was otherwise noncontributory  PAST MEDICAL HISTORY: Past Medical History  Diagnosis Date  . Allergic rhinitis     hay fever  . GERD (gastroesophageal reflux disease)   . Hyperlipidemia   . Food allergy     carrots, celery, kiwi, apple, peanuts  . Heart murmur   . Headache(784.0)   . Breast cancer of upper-inner quadrant of right female breast ([place]) 05/03/2015  . Breast cancer ([place])     PAST SURGICAL HISTORY: Past Surgical History  Procedure Laterality Date  . Cesarean section  2000,2004    x 2    FAMILY HISTORY Family History  Problem Relation Age of Onset  . Diabetes Mother   . Hyperlipidemia Mother   . Breast cancer Cousin   As of March 2017 the patient's parents are still living, her father age 80 and her mother age 3. The patient has 2 brothers and 2 sisters. One maternal cousin was diagnosed with breast cancer in her late 37s.  GYNECOLOGIC HISTORY:  No LMP recorded. Menarche age 4, first live birth age 69, which the patient understands increases the  risk of breast cancer. She is GX P2. She stopped having periods in December 2016 and used hormone replacement for approximately 2 months. She used oral  contraceptives remotely for approximately 10 years.  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 The patient is originally from the [place]. Her husband Taylor Zamora is originally from [place]. Their daughter Taylor Zamora and son Taylor Zamora are currently 74 and 12, respectively.    ADVANCED DIRECTIVES: Not in place   HEALTH MAINTENANCE: Social History  Substance Use Topics  . Smoking status: Never Smoker   . Smokeless tobacco: Never Used  . Alcohol Use: No     Colonoscopy:Never  PAP:  Bone density: Remote  Lipid panel:  Allergies  Allergen Reactions  . Aspirin   . Peanut-Containing Drug Products     Current Outpatient Prescriptions  Medication Sig Dispense Refill  . Probiotic Product (PROBIOTIC PO) Take by mouth.    . Multiple Vitamin (MULTIVITAMIN) tablet Take 1 tablet by mouth daily.       No current facility-administered medications for this visit.    OBJECTIVE: Young-appearing Filipino woman in no acute distress Filed Vitals:   05/11/15 0852  BP: 109/58  Pulse: 60  Temp: 98 F (36.7 C)  Resp: 18     Body mass index is 26.87 kg/(m^2).    ECOG FS:1 - Symptomatic but completely ambulatory  Ocular: Sclerae unicteric, pupils equal, round and reactive to light Ear-nose-throat: Oropharynx clear and moist Lymphatic: No cervical or supraclavicular adenopathy Lungs no rales or rhonchi, good excursion bilaterally Heart regular rate and rhythm, no murmur appreciated Abd soft, nontender, positive bowel sounds MSK no focal spinal tenderness, no joint edema Neuro: non-focal, well-oriented, appropriate affect Breasts: In the superior aspect of the right breast there is an area of induration which measures approximately 1 cm by palpation. It is movable. It is not associated with any skin or nipple changes. The right axilla is benign. The left breast is unremarkable.   LAB RESULTS:  CMP     Component  Value Date/Time   NA 140 05/11/2015 0841   NA 142 01/02/2007 1436   K 3.5 05/11/2015 0841   K 3.8 01/02/2007 1436   CL 106 01/02/2007 1436   CO2 25 05/11/2015 0841   CO2 27 01/02/2007 1436   GLUCOSE 87 05/11/2015 0841   GLUCOSE 89 01/02/2007 1436   BUN 16.6 05/11/2015 0841   BUN 16 01/02/2007 1436   CREATININE 0.7 05/11/2015 0841   CREATININE 0.7 01/02/2007 1436   CALCIUM 9.0 05/11/2015 0841   CALCIUM 9.8 01/02/2007 1436   PROT 7.4 05/11/2015 0841   PROT 8.1 01/02/2007 1436   ALBUMIN 3.9 05/11/2015 0841   ALBUMIN 4.3 01/02/2007 1436   AST 17 05/11/2015 0841   AST 22 01/02/2007 1436   ALT 13 05/11/2015 0841   ALT 21 01/02/2007 1436   ALKPHOS 52 05/11/2015 0841   ALKPHOS 57 01/02/2007 1436   BILITOT 0.41 05/11/2015 0841   BILITOT 0.5 01/02/2007 1436   GFRNONAA 99 01/02/2007 1436   GFRAA 119 01/02/2007 1436    INo results found for: SPEP, UPEP  Lab Results  Component Value Date   WBC 5.3 05/11/2015   NEUTROABS 2.4 05/11/2015   HGB 13.0 05/11/2015   HCT 37.5 05/11/2015   MCV 88.9 05/11/2015   PLT 270 05/11/2015      Chemistry      Component Value Date/Time   NA 140 05/11/2015 0841  NA 142 01/02/2007 1436   K 3.5 05/11/2015 0841   K 3.8 01/02/2007 1436   CL 106 01/02/2007 1436   CO2 25 05/11/2015 0841   CO2 27 01/02/2007 1436   BUN 16.6 05/11/2015 0841   BUN 16 01/02/2007 1436   CREATININE 0.7 05/11/2015 0841   CREATININE 0.7 01/02/2007 1436      Component Value Date/Time   CALCIUM 9.0 05/11/2015 0841   CALCIUM 9.8 01/02/2007 1436   ALKPHOS 52 05/11/2015 0841   ALKPHOS 57 01/02/2007 1436   AST 17 05/11/2015 0841   AST 22 01/02/2007 1436   ALT 13 05/11/2015 0841   ALT 21 01/02/2007 1436   BILITOT 0.41 05/11/2015 0841   BILITOT 0.5 01/02/2007 1436       No results found for: LABCA2  No components found for: LABCA125  No results for input(s): INR in the last 168 hours.  Urinalysis    Component Value Date/Time   COLORURINE YELLOW 01/02/2007  1436   APPEARANCEUR Clear 01/02/2007 1436   LABSPEC > OR = 1.030 01/02/2007 1436   PHURINE 5.5 01/02/2007 1436   GLUCOSEU NEGATIVE 01/02/2007 1436   BILIRUBINUR NEGATIVE 01/02/2007 1436   KETONESUR NEGATIVE 01/02/2007 1436   UROBILINOGEN 0.2 mg/dL 01/02/2007 1436   NITRITE Negative 01/02/2007 1436   LEUKOCYTESUR Negative 01/02/2007 1436      ELIGIBLE FOR AVAILABLE RESEARCH PROTOCOL: No  STUDIES: Mm Digital Diagnostic Unilat R  04/29/2015  CLINICAL DATA:  Post ultrasound-guided biopsy of a shadowing mass/ area of distortion in the right breast at the 12:30 position. EXAM: DIAGNOSTIC RIGHT MAMMOGRAM POST ULTRASOUND BIOPSY COMPARISON:  Previous exam(s). FINDINGS: Mammographic images were obtained following ultrasound guided biopsy of a shadowing mass/ area of distortion in the right breast at the 12:30 position. A ribbon shaped biopsy marking clip is present in the targeted location of the biopsied right breast mass. IMPRESSION: Appropriate ribbon shaped biopsy marking clip position post ultrasound-guided biopsy of a shadowing mass/ area of distortion at the 12:30 position in the right breast. Final Assessment: Post Procedure Mammograms for Marker Placement Electronically Signed   By: Everlean Alstrom M.D.   On: 04/29/2015 16:25   [place] Rt Breast Bx W Loc Dev 1st Lesion Img Bx Spec US Guide  05/06/2015  ADDENDUM REPORT: 05/02/2015 12:52 ADDENDUM: Pathology revealed grade II invasive ductal carcinoma in the right breast. This was found to be concordant by Dr. Everlean Alstrom. Pathology results were discussed with the patient by telephone. The patient reported doing well after the biopsy. Post biopsy instructions and care were reviewed and questions were answered. The patient was encouraged to call [facility] for any additional concerns. The patient was referred to the [facility] at the [facility] on May 11, 2015. Pathology  results reported by Susa Raring RN, BSN on 05/02/2015. Electronically Signed   By: Everlean Alstrom M.D.   On: 05/02/2015 12:52  05/06/2015  CLINICAL DATA:  50 year old female with suspicious shadowing mass/distortion in the right breast at the approximate 02/1929 location. EXAM: ULTRASOUND GUIDED RIGHT BREAST CORE NEEDLE BIOPSY COMPARISON:  Previous exam(s). FINDINGS: I met with the patient and we discussed the procedure of ultrasound-guided biopsy, including benefits and alternatives. We discussed the high likelihood of a successful procedure. We discussed the risks of the procedure, including infection, bleeding, tissue injury, clip migration, and inadequate sampling. Informed written consent was given. The usual time-out protocol was performed immediately prior to the procedure. Using sterile  technique and 1% Lidocaine as local anesthetic, under direct ultrasound visualization, a 12 gauge spring-loaded device was used to perform biopsy of the mass in the right breast at the 12:30 position using a lateral to medial approach. At the conclusion of the procedure a ribbon shaped tissue marker clip was deployed into the biopsy cavity. Follow up 2 view mammogram was performed and dictated separately. IMPRESSION: Ultrasound guided biopsy of the mass in the right breast at the 12:30 position. No apparent complications. Electronically Signed: By: Everlean Alstrom M.D. On: 04/29/2015 16:22    ASSESSMENT: 50 y.o. [place] woman status post right breast biopsy 04/29/2015 for a clinical T1b N0, stage IA  invasive ductal carcinoma, grade 2, estrogen and progesterone receptor positive, HER-2 nonamplified, with an MIB-1 of 5%.  (1) definitive surgery pending  (2) Oncotype DX to be obtained from the surgical specimen  (3) adjuvant radiation to follow  (4) anti-estrogens to follow radiation   PLAN: We spent the better part of today's hour-long appointment discussing the biology of breast cancer in general, and the  specifics of the patient's tumor in particular. Taylor Zamora understands that she has an early stage estrogen receptor positive slow-growing breast cancer. Many women like her do well with local treatment only, which would be surgery and radiation.  She understands however that there is some risk, even if low, of this cancer already having spread microscopically to other parts of her body. She understands CT scans would not settle that issue and there is no blood test that can rule out microscopic cancer spread.  For that reason she will need systemic therapy. She will not be a candidate for anti-HER-2 immunotherapy since her cancer does not overexpress HER-2. She will be an excellent candidate for anti-estrogens, specifically tamoxifen, and we discussed the mechanism of action of that drug and the duration of treatment.  The more complicated question his chemotherapy. It is very likely she would have a very marginal benefit from chemotherapy. To make sure we are not missing anything however we will send an Oncotype test. That is, be available 2 weeks or so after her surgery, so I anticipate seeing Vangie approximately 5 weeks from now, but which time we should have those results. She understands however that I do not anticipate at this point her receiving chemotherapy.  They're very worried about their children. My suggestion to her is that she tell the children what I am telling her, namely that she has breast cancer, and that we expect it to be cured. I also alerted them to not allowing her children to get bad grades in school or act out with the excuse of their worried about her. If the children do seem to be somewhat more disturbed than they expect I suggested the [facility] program.  Taylor Zamora has a good understanding of the overall plan. She agrees with it. She knows the goal of treatment in her case is cure. She will call with any problems that may develop before her next visit here.  Chauncey Cruel, MD    05/13/2015 2:17 PM Medical Oncology and Hematology [facility] [address] [place], [place] [address] Tel. [phone]    Fax. [phone]

## 2015-05-11 NOTE — Telephone Encounter (Signed)
s.w. pt and r/s appt per pt request....pt ok and aware of new d.t °

## 2015-05-11 NOTE — Progress Notes (Signed)
[facility] Psychosocial Distress Screening Clinical Social Work  Clinical Social Work met with pt and her husband at [facility] to review distress screening protocol, introduce self and discuss Support Programs to assist..  The patient scored a 8 on the Psychosocial Distress Thermometer which indicates severe distress. Clinical Social Worker met with pt and husband at length to assess for distress and other psychosocial needs. Pt shared she had not been able to sleep for several years, this has been discussed with her PCP, but not fully addressed. Pt also appears to be struggling with anxiety, marital concerns and stressors of teenagers. She has numerous stressors in the home, as she is a single mom with four children. She reports to have a daughter, 83 yo and son age 55. . 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 They openly shared their marriage has had several challenges. She shared they had been to counseling, but "nothing changed". They are considering Support Services here for additional support, but not ready to make appt today. . We discussed today that it is very important to take care of the whole person while going through cancer care. Pt will consider additional support options and reach back out to CSW. CSW also discussed various resources to help with finances through [employer], etc. CSW plans to check in with pt when she returns, pt agrees to reach out for support/counseling when she is ready.   ONCBCN DISTRESS SCREENING 05/11/2015  Screening Type Initial Screening  Distress experienced in past week (1-10) 8  Practical problem type Housing;Insurance;Work/school  Family Problem type Partner;Children  Emotional problem type Nervousness/Anxiety;Adjusting to illness;Adjusting to appearance changes  Spiritual/Religous concerns type Relating to God;Facing my mortality  Physical Problem type Pain;Sleep/insomnia;Breathing;Tingling hands/feet;Sexual problems;Swollen arms/legs   Physician notified of physical symptoms Yes  Referral to clinical social work Yes  Referral to financial advocate Yes  Referral to support programs Yes    Clinical Social Worker follow up needed: Yes.    If yes, follow up plan: See above Loren Racer, [place] Worker [place]  [facility] Phone: [phone] Fax: [phone]

## 2015-05-11 NOTE — Telephone Encounter (Signed)
lvm for pt regarding to April appt....pt ok and aware

## 2015-05-11 NOTE — Progress Notes (Signed)
Subjective:     Patient ID: Taylor Zamora, female   DOB: 12-21-1965, 50 y.o.   MRN: [id number]  HPI   Review of Systems     Objective:   Physical Exam For the patient to understand and be given the tools to implement a healthy plant based diet during their cancer diagnosis.     Assessment:     Patient was seen today and found to be in good spirits and accompanied by her husband. Pts medications probiotic and multivitamin. Pts ht 4'11'', wt 133 pounds, BMI 26.9. Pts medical: hyperlipidemia and GERD. Pt was inquisitive and attentive. Pt questioned her caffeine in take of 2 cups of coffee. Pt states she has heart palpitations/anxiety when she drinks coffee and she has been drinking these amount in the same frequency for about 6 years.      Plan:     Dietitian educated the patient on implementing a plant based diet by incorporating more plant proteins, fruits, and vegetables. As a part of a healthy routine physical activity was discussed. Dietitian advised if she is concernes with her caffeine intake and has physical symptoms she can start to ween herself off of it and switch to decaf.  The importance of legitimate, evidence based information was discussed and examples were given. A folder of evidence based information with a focus on a plant based diet and general nutrition during cancer was given to the patient.  As a part of the continuum of care the cancer dietitian's contact information was given to the patient in the event they would like to have a follow up appointment.

## 2015-05-11 NOTE — Patient Instructions (Signed)

## 2015-05-11 NOTE — Progress Notes (Signed)
Ms. Dancel is a very pleasant 50 y.o. female from [place], [place] with newly diagnosed invasive ductal carcinoma of the right breast.  Biopsy results revealed the tumor's prognostic profile is ER positive, PR positive, and HER2/neu negative.  She presents today with her husband to the [facility]) for treatment consideration and recommendations from the breast surgeon, radiation oncologist, and medical oncologist.     I briefly met with Ms. Bidwell and her husband during her [facility] visit today. We discussed the purpose of the Survivorship Clinic, which will include monitoring for recurrence, coordinating completion of age and gender-appropriate cancer screenings, promotion of overall wellness, as well as managing potential late/long-term side effects of anti-cancer treatments.    The treatment plan for Ms. Elmore will likely include surgery, radiation therapy, and anti-estrogen therapy.  As of today, the intent of treatment for Ms. Hidrogo is cure, therefore she will be eligible for the Survivorship Clinic upon her completion of treatment.  Her survivorship care plan (SCP) document will be drafted and updated throughout the course of her treatment trajectory. She will receive the SCP in an office visit with myself in the Survivorship Clinic once she has completed treatment.   Ms. Crow was encouraged to ask questions and all questions were answered to her satisfaction.  She was given my business card and encouraged to contact me with any concerns regarding survivorship.  I look forward to participating in her care.   Kenn File, [place] [phone]

## 2015-05-11 NOTE — Progress Notes (Signed)
Radiation Oncology         [phone] ________________________________  Initial outpatient Consultation  Name: Taylor Zamora MRN: [id number]  Date: 05/11/2015  DOB: [birth year]  SN:KNLZ, CAMMIE, MD  Alphonsa Overall, MD   REFERRING PHYSICIAN: Alphonsa Overall, MD  DIAGNOSIS: The encounter diagnosis was Breast cancer of upper-inner quadrant of right female breast ([place]). Clinical stage I invasive ductal carcinoma  HISTORY OF PRESENT ILLNESS::Taylor Zamora is a 50 y.o. female who presented for a screening mammogram on 02/16/15. This found a possible asymmetry in the right breast. On 02/22/25, she had a mammogram and ultrasound of the right breast and axilla. Mammogram revealed a suspicious area of distortion in the superior right breast, posterior depth, at the 12 o'clock position. Ultrasound demonstrated a 7-8 mm mass in the superior right breast at the 12:30 o'clock position 5 cm from the nipple. The right axilla demonstrated no suspicious lymphadenopathy.  Biopsy of the right breast in the 12:30 o'clock position on 04/29/15 revealed grade 2 invasive ductal carcinoma (ER positive 80%, PR positive 90%, HER2 negative, Ki67 5%).  PREVIOUS RADIATION THERAPY: No  PAST MEDICAL HISTORY:  has a past medical history of Allergic rhinitis; GERD (gastroesophageal reflux disease); Hyperlipidemia; Food allergy; Heart murmur; Headache(784.0); Breast cancer of upper-inner quadrant of right female breast ([place]) (05/03/2015); and Breast cancer ([place]).    Gynecologic History  Age at first menstrual period? 14  Are you still having periods? ? Approximate date of last period? Dec 2016  If you are still having periods: Are your periods regular? Yes  If you no longer have periods: Have you used hormone replacement? No Obstetric History:  How many children have you carried to term? 2 Your age at first live birth? 67  Pregnant now or trying to get pregnant? No  Have you used birth control pills or hormone  shots for contraception? Yes  If so, for how long (or approximate dates)? 10 years?  Would you be interested in learning more about the options to preserve fertility? No Health Maintenance:  Have you ever had a colonoscopy? No  Have you ever had a bone density? Yes If yes, date? 8-10 years ago?  Date of your last PAP smear? Oct 2016 Date of your FIRST mammogram? 50 years old?   PAST SURGICAL HISTORY: Past Surgical History  Procedure Laterality Date  . Cesarean section  2000,2004    x 2    FAMILY HISTORY: family history includes Breast cancer in her cousin; Diabetes in her mother; Hyperlipidemia in her mother.  SOCIAL HISTORY:  reports that she has never smoked. She has never used smokeless tobacco. She reports that she does not drink alcohol or use illicit drugs.  ALLERGIES: Aspirin and Peanut-containing drug products  MEDICATIONS:  Current Outpatient Prescriptions  Medication Sig Dispense Refill  . Multiple Vitamin (MULTIVITAMIN) tablet Take 1 tablet by mouth daily.      . Probiotic Product (PROBIOTIC PO) Take by mouth.     No current facility-administered medications for this encounter.    REVIEW OF SYSTEMS:  A 15 point review of systems is documented in the electronic medical record. This was obtained by the nursing staff. However, I reviewed this with the patient to discuss relevant findings and make appropriate changes.  Pertinent items noted in HPI and remainder of comprehensive ROS otherwise negative.  The patient complains of night sweats, loss of sleep, fatigue that affects her activities, headaches with throbbing pain, muscle aches, cramping, wears glasses, change in vision, blurred vision,  sinus problems, irregular heartbeat and chest pain when stressed, shortness of breath at rest/walking/stairs, heartburn, breast masses/pain/tenderness, numbness, and weakness.  PHYSICAL EXAM:  Vitals with BMI 05/11/2015  Height '4\' 11"'   Weight 133 lbs 2 oz  BMI 77.8  Systolic 242    Diastolic 58  Pulse 60  Respirations 18   General: Alert and oriented, in no acute distress, accompanied by husband on evaluation today HEENT: Head is normocephalic. Extraocular movements are intact. Oropharynx is clear. Neck: Neck is supple, no palpable cervical or supraclavicular lymphadenopathy. Heart: Regular in rate and rhythm with no murmurs, rubs, or gallops. Chest: Clear to auscultation bilaterally, with no rhonchi, wheezes, or rales. Extremities: No cyanosis or edema. Lymphatics: see Neck Exam Breast: Small biopsy site in the upper right breast Skin: No concerning lesions. Musculoskeletal: symmetric strength and muscle tone throughout. Neurologic: Cranial nerves II through XII are grossly intact. No obvious focalities. Speech is fluent. Coordination is intact. Psychiatric: Judgment and insight are intact. Affect is appropriate.  ECOG = 1  LABORATORY DATA:  Lab Results  Component Value Date   WBC 5.3 05/11/2015   HGB 13.0 05/11/2015   HCT 37.5 05/11/2015   MCV 88.9 05/11/2015   PLT 270 05/11/2015   NEUTROABS 2.4 05/11/2015   Lab Results  Component Value Date   NA 140 05/11/2015   K 3.5 05/11/2015   CL 106 01/02/2007   CO2 25 05/11/2015   GLUCOSE 87 05/11/2015   CREATININE 0.7 05/11/2015   CALCIUM 9.0 05/11/2015      RADIOGRAPHY: Mm Digital Diagnostic Unilat R  04/29/2015  CLINICAL DATA:  Post ultrasound-guided biopsy of a shadowing mass/ area of distortion in the right breast at the 12:30 position. EXAM: DIAGNOSTIC RIGHT MAMMOGRAM POST ULTRASOUND BIOPSY COMPARISON:  Previous exam(s). FINDINGS: Mammographic images were obtained following ultrasound guided biopsy of a shadowing mass/ area of distortion in the right breast at the 12:30 position. A ribbon shaped biopsy marking clip is present in the targeted location of the biopsied right breast mass. IMPRESSION: Appropriate ribbon shaped biopsy marking clip position post ultrasound-guided biopsy of a shadowing mass/  area of distortion at the 12:30 position in the right breast. Final Assessment: Post Procedure Mammograms for Marker Placement Electronically Signed   By: Everlean Alstrom M.D.   On: 04/29/2015 16:25   [place] Rt Breast Bx W Loc Dev 1st Lesion Img Bx Spec US Guide  05/06/2015  ADDENDUM REPORT: 05/02/2015 12:52 ADDENDUM: Pathology revealed grade II invasive ductal carcinoma in the right breast. This was found to be concordant by Dr. Everlean Alstrom. Pathology results were discussed with the patient by telephone. The patient reported doing well after the biopsy. Post biopsy instructions and care were reviewed and questions were answered. The patient was encouraged to call [facility] for any additional concerns. The patient was referred to the [facility] at the [facility] on May 11, 2015. Pathology results reported by Susa Raring RN, BSN on 05/02/2015. Electronically Signed   By: Everlean Alstrom M.D.   On: 05/02/2015 12:52  05/06/2015  CLINICAL DATA:  50 year old female with suspicious shadowing mass/distortion in the right breast at the approximate 02/1929 location. EXAM: ULTRASOUND GUIDED RIGHT BREAST CORE NEEDLE BIOPSY COMPARISON:  Previous exam(s). FINDINGS: I met with the patient and we discussed the procedure of ultrasound-guided biopsy, including benefits and alternatives. We discussed the high likelihood of a successful procedure. We discussed the risks of the procedure, including infection, bleeding, tissue  injury, clip migration, and inadequate sampling. Informed written consent was given. The usual time-out protocol was performed immediately prior to the procedure. Using sterile technique and 1% Lidocaine as local anesthetic, under direct ultrasound visualization, a 12 gauge spring-loaded device was used to perform biopsy of the mass in the right breast at the 12:30 position using a lateral to medial approach. At the conclusion of  the procedure a ribbon shaped tissue marker clip was deployed into the biopsy cavity. Follow up 2 view mammogram was performed and dictated separately. IMPRESSION: Ultrasound guided biopsy of the mass in the right breast at the 12:30 position. No apparent complications. Electronically Signed: By: Everlean Alstrom M.D. On: 04/29/2015 16:22      IMPRESSION: Clinical stage I invasive ductal carcinoma. She would be a good candidate for breast conservation therapy with radiation treatments as a component of this treatment. She does not wish to consider mastectomy. Family oncologic history not significant enough to consider genetic testing.  PLAN: The patient will undergo a right lumpectomy and sentinel lymph node biopsy. She would then have an oncotype test to determine if she is a candidate for chemotherapy. She would then receive radiation and anti-estrogen medication.   ------------------------------------------------  Blair Promise, PhD, MD  This document serves as a record of services personally performed by Gery Pray, MD. It was created on his behalf by Darcus Austin, a trained medical scribe. The creation of this record is based on the scribe's personal observations and the provider's statements to them. This document has been checked and approved by the attending provider.

## 2015-05-16 ENCOUNTER — Telehealth: Payer: Self-pay | Admitting: *Deleted

## 2015-05-16 ENCOUNTER — Other Ambulatory Visit: Payer: Self-pay | Admitting: Surgery

## 2015-05-16 DIAGNOSIS — C50911 Malignant neoplasm of unspecified site of right female breast: Secondary | ICD-10-CM

## 2015-05-16 NOTE — Telephone Encounter (Signed)
Left vm for pt to return call regarding [place] from 05/11/15. Contact information provided.

## 2015-05-17 ENCOUNTER — Telehealth: Payer: Self-pay | Admitting: Hematology

## 2015-05-17 NOTE — Telephone Encounter (Signed)
cld pt and left message for appt 5/24 @10 :00-per p

## 2015-05-18 ENCOUNTER — Telehealth: Payer: Self-pay | Admitting: Oncology

## 2015-05-18 NOTE — Telephone Encounter (Signed)
per previous not should say Dr Jana Hakim patient not Dr Wyatt Mage to [place] pt cld to r/s appt to 5/1 woth [place] sent pof to sch appt for 5/24 and pt was cld and given that time & date-keisha stated she would call pt on 3/16 to adv to CX the 5/1 appt due to her surgery

## 2015-06-14 ENCOUNTER — Encounter (HOSPITAL_BASED_OUTPATIENT_CLINIC_OR_DEPARTMENT_OTHER): Payer: Self-pay | Admitting: *Deleted

## 2015-06-15 HISTORY — PX: BREAST LUMPECTOMY: SHX2

## 2015-06-17 ENCOUNTER — Ambulatory Visit: Payer: BLUE CROSS/BLUE SHIELD | Admitting: Oncology

## 2015-06-22 ENCOUNTER — Ambulatory Visit
Admission: RE | Admit: 2015-06-22 | Discharge: 2015-06-22 | Disposition: A | Discharge status: Home / Self Care | Payer: BLUE CROSS/BLUE SHIELD | Source: Ambulatory Visit | Attending: Surgery | Admitting: Surgery

## 2015-06-22 DIAGNOSIS — C50911 Malignant neoplasm of unspecified site of right female breast: Secondary | ICD-10-CM

## 2015-06-22 NOTE — H&P (Signed)
Taylor Zamora  Location: [facility] Patient #: [id number] DOB: 08/19/1965 Undefined / Language: English / Race: Undefined Female  History of Present Illness   The patient is a 49 year old female who presents with breast cancer.   Goes by "Taylor Zamora"  Her PCP is Dr. Cammie Zamora  [facility] - Drs. Taylor Zamora/Taylor Zamora  She is here with her husband, Taylor Zamora.   Her last mammogram was about 1 year ago. She had noticed some tenderness in the upper portions of both breast, but no specific mass. Her last period was December 2016. She is on no hormonal therapy. She has a mother's cousinwho had breast cancer.   [facility] which showed a suspicious area of distortion in the superior right breast on mammography corresponds with an ill-defined shadowing mass at 1230. A right breast biopsy on 04/29/2015 ([id number]) showed a grade II IDC, ER - 80%, PR - 90%, Ki67- 5%, Her2Neu - neg.   I discussed the options for breast cancer treatment with the patient. She is in the Breast multidisciplinary clinic, which includes medical oncology and radiation oncology. I discussed the surgical options of lumpectomy vs. mastectomy. If mastectomy, there is the possibility of reconstruction. I discussed the options of lymph node biopsy. The treatment plan depends on the pathologic staging of the tumor and the patient's personal wishes. The risks of surgery include, but are not limited to, bleeding, infection, the need for further surgery, and nerve injury. The patient has been given literature on the treatment of breast cancer.  Plan: 1) Right breast (seed loc) lumpectomy and right axillary SLNBx, 2) Genetics (this was questioned, because she has only one remote cousin with cancer), 3) Oncotype  Past Medical History: 1) Fatigued - but not sure why (but she is only sleeping 2 to 3 hours per night at a  time) 2) Right arm numbness 3) Her neck bothers her - she feels tension 4) Bloating, occasionally  Social History: She is here with her husband, Taylor Zamora. 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 Caffeine use Coffee. No drug use Tobacco use Never smoker.  Family History (Taylor Smith, RN; 05/11/2015 7:44 AM) Diabetes Mellitus Mother. Hypertension Mother.  Pregnancy / Birth History (Taylor Smith, RN; 05/11/2015 7:44 AM) Age at menarche 14 years. Contraceptive History Intrauterine device, Oral contraceptives. Gravida 3 Irregular periods Maternal age 31-35 Para 2  Review of Systems (Taylor Smith RN; 05/11/2015 7:44 AM) General Present- Fatigue and Night Sweats. Not Present- Appetite Loss, Chills, Fever, Weight Gain and Weight Loss. Skin Not Present- Change in Wart/Mole, Dryness, Hives, Jaundice, New Lesions, Non-Healing Wounds, Rash and Ulcer. HEENT Present- Seasonal Allergies and Wears glasses/contact lenses. Not Present- Earache, Hearing Loss, Hoarseness, Nose Bleed, Oral Ulcers, Ringing in the Ears, Sinus Pain, Sore Throat, Visual Disturbances and Yellow Eyes. Respiratory Not Present- Bloody sputum, Chronic Cough, Difficulty Breathing, Snoring and Wheezing. Breast Present- Breast Pain.   Not Present- Breast Mass, Nipple Discharge and Skin Changes. Cardiovascular  Present- Difficulty Breathing Lying Down and Shortness of Breath. Not Present- Chest Pain, Leg Cramps, Palpitations, Rapid Heart Rate and Swelling of Extremities. Gastrointestinal Present- Abdominal Pain, Bloating and Indigestion. Not Present- Bloody Stool, Change in Bowel Habits, Chronic diarrhea, Constipation, Difficulty Swallowing, Excessive gas, Gets full quickly at meals, Hemorrhoids, Nausea, Rectal Pain and Vomiting. Female Genitourinary Not Present- Frequency, Nocturia, Painful Urination, Pelvic Pain and Urgency. Musculoskeletal Present- Joint Pain, Joint Stiffness and Muscle Pain. Not Present- Back Pain, Muscle Weakness and Swelling of Extremities. Neurological Present- Headaches, Numbness and Weakness. Not Present- Decreased Memory, Fainting, Seizures, Tingling, Tremor and Trouble walking. Psychiatric Not Present- Anxiety, Bipolar, Change in Sleep Pattern, Depression, Fearful and Frequent crying. Endocrine Present- Hot flashes. Not Present- Cold Intolerance, Excessive Hunger, Hair Changes, Heat Intolerance and New Diabetes. Hematology Not Present- Easy Bruising, Excessive bleeding, Gland problems, HIV and Persistent Infections.   Physical Exam  General: Hispanic WFalert and generally healthy appearing. HEENT: Normal. Pupils equal.  Neck: Supple. No mass. No thyroid mass.  Lymph Nodes: No supraclavicular, cervical, or axillary nodes.  Lungs: Clear to auscultation and symmetric breath sounds. Heart: RRR. No murmur or rub.  Breasts: Right - Puncture site at 10 o'clock, but I do not feel a mass or nodule Left - No mass  Abdomen: Soft. No mass. No tenderness. No hernia. Normal bowel sounds.   Extremities: Good strength and ROM in upper and lower extremities.  Neurologic: Grossly intact to motor and sensory function. Psychiatric: Has normal mood and affect. Behavior is normal.  Assessment & Plan  1.  BREAST CANCER, STAGE 1, RIGHT (C50.911)  Story: A right breast  biopsy on 04/29/2015 ([id number]) showed a grade II IDC, ER - 80%, PR - 90%, Ki67- 5%, Her2Neu - neg.   Oncology - Drs. Taylor Zamora/Taylor Zamora  Plan:   1) Right breast lumpectomy (seed localization) and right axillary SLNBx,   2) Genetics (this was questioned, because she has only one remote cousin with cancer),   3) Oncotype   4) They had questions about the cost and were interesed in financial counseling  2. Fatigued - but not sure why (but she is only sleeping 2 to 3 hours per night at a time) 3. Right arm numbness 4. Her neck bothers her - she feels tension   Alphonsa Overall, MD, [facility] Pager: [phone] Office phone:  [phone]

## 2015-06-23 ENCOUNTER — Encounter (HOSPITAL_COMMUNITY): Payer: BLUE CROSS/BLUE SHIELD

## 2015-06-23 ENCOUNTER — Encounter (HOSPITAL_BASED_OUTPATIENT_CLINIC_OR_DEPARTMENT_OTHER): Payer: Self-pay | Admitting: *Deleted

## 2015-06-23 ENCOUNTER — Ambulatory Visit
Admission: RE | Admit: 2015-06-23 | Discharge: 2015-06-23 | Disposition: A | Discharge status: Home / Self Care | Payer: BLUE CROSS/BLUE SHIELD | Source: Ambulatory Visit | Attending: Surgery | Admitting: Surgery

## 2015-06-23 ENCOUNTER — Ambulatory Visit (HOSPITAL_BASED_OUTPATIENT_CLINIC_OR_DEPARTMENT_OTHER)
Admission: RE | Admit: 2015-06-23 | Discharge: 2015-06-23 | Disposition: A | Discharge status: Home / Self Care | Payer: BLUE CROSS/BLUE SHIELD | Source: Ambulatory Visit | Attending: Surgery | Admitting: Surgery

## 2015-06-23 DIAGNOSIS — C50911 Malignant neoplasm of unspecified site of right female breast: Secondary | ICD-10-CM

## 2015-06-23 DIAGNOSIS — Z5309 Procedure and treatment not carried out because of other contraindication: Secondary | ICD-10-CM | POA: Diagnosis not present

## 2015-06-23 MED ORDER — CEFAZOLIN SODIUM-DEXTROSE 2-4 GM/100ML-% IV SOLN
INTRAVENOUS | Status: AC
  Filled 2015-06-23: qty 100

## 2015-06-23 MED ORDER — LACTATED RINGERS IV SOLN: INTRAVENOUS | Status: DC

## 2015-06-23 MED ORDER — SCOPOLAMINE 1 MG/3DAYS TD PT72: 1.0000 | MEDICATED_PATCH | Freq: Once | TRANSDERMAL | Status: DC | PRN

## 2015-06-23 MED ORDER — CHLORHEXIDINE GLUCONATE 4 % EX LIQD: 1.0000 "application " | Freq: Once | CUTANEOUS | Status: DC

## 2015-06-23 MED ORDER — MIDAZOLAM HCL 2 MG/2ML IJ SOLN
INTRAMUSCULAR | Status: AC
  Filled 2015-06-23: qty 2

## 2015-06-23 MED ORDER — BUPIVACAINE-EPINEPHRINE (PF) 0.5% -1:200000 IJ SOLN
INTRAMUSCULAR | Status: AC
  Filled 2015-06-23: qty 30

## 2015-06-23 MED ORDER — GLYCOPYRROLATE 0.2 MG/ML IJ SOLN: 0.2000 mg | Freq: Once | INTRAMUSCULAR | Status: DC | PRN

## 2015-06-23 MED ORDER — DEXTROSE 5 % IV SOLN: 2.0000 g | INTRAVENOUS | Status: DC

## 2015-06-23 MED ORDER — FENTANYL CITRATE (PF) 100 MCG/2ML IJ SOLN
INTRAMUSCULAR | Status: AC
  Filled 2015-06-23: qty 2

## 2015-06-23 MED ORDER — FENTANYL CITRATE (PF) 100 MCG/2ML IJ SOLN: 50.0000 ug | INTRAMUSCULAR | Status: DC | PRN

## 2015-06-23 MED ORDER — SODIUM CHLORIDE 0.9 % IJ SOLN
INTRAMUSCULAR | Status: AC
  Filled 2015-06-23: qty 10

## 2015-06-23 MED ORDER — METHYLENE BLUE 0.5 % INJ SOLN
INTRAVENOUS | Status: AC
  Filled 2015-06-23: qty 10

## 2015-06-23 MED ORDER — MIDAZOLAM HCL 2 MG/2ML IJ SOLN: 1.0000 mg | INTRAMUSCULAR | Status: DC | PRN

## 2015-06-23 NOTE — Progress Notes (Signed)
Pt misunderstood pre op instructions and ate breakfast at 0900. Discussed with Dr Thomes Cake who said surgery needs to be cancelled for today. OR desk called Dr Pollie Friar office and plans are being made to reschedule for Monday. Pt changing clothes and instructed to call Dr Pollie Friar office when she gets home to make arrangements for rescheduling. Pt disappointed but understood.

## 2015-06-24 NOTE — Progress Notes (Signed)
Pt rescheduled for surgery with Dr. Alphonsa Overall on Monday 06/27/2015. Called Dr. Pollie Friar office spoke with nurse advised will need new orders and to schedule with nuc med again. Verbalized  Understanding.

## 2015-06-25 ENCOUNTER — Other Ambulatory Visit: Payer: Self-pay | Admitting: Surgery

## 2015-06-25 DIAGNOSIS — C50911 Malignant neoplasm of unspecified site of right female breast: Secondary | ICD-10-CM

## 2015-06-27 ENCOUNTER — Encounter (HOSPITAL_BASED_OUTPATIENT_CLINIC_OR_DEPARTMENT_OTHER): Payer: Self-pay | Admitting: *Deleted

## 2015-06-27 ENCOUNTER — Ambulatory Visit (HOSPITAL_BASED_OUTPATIENT_CLINIC_OR_DEPARTMENT_OTHER)
Admission: RE | Admit: 2015-06-27 | Discharge: 2015-06-27 | Disposition: A | Discharge status: Home / Self Care | Payer: BLUE CROSS/BLUE SHIELD | Source: Ambulatory Visit | Attending: Surgery | Admitting: Surgery

## 2015-06-27 ENCOUNTER — Encounter (HOSPITAL_BASED_OUTPATIENT_CLINIC_OR_DEPARTMENT_OTHER)
Admission: RE | Disposition: A | Discharge status: Home / Self Care | Payer: Self-pay | Source: Ambulatory Visit | Attending: Surgery

## 2015-06-27 ENCOUNTER — Ambulatory Visit (HOSPITAL_BASED_OUTPATIENT_CLINIC_OR_DEPARTMENT_OTHER): Admission: RE | Admit: 2015-06-27 | Payer: BLUE CROSS/BLUE SHIELD | Source: Ambulatory Visit | Admitting: Surgery

## 2015-06-27 ENCOUNTER — Ambulatory Visit (HOSPITAL_COMMUNITY)
Admission: RE | Admit: 2015-06-27 | Discharge: 2015-06-27 | Disposition: A | Discharge status: Home / Self Care | Payer: BLUE CROSS/BLUE SHIELD | Source: Ambulatory Visit | Attending: Surgery | Admitting: Surgery

## 2015-06-27 ENCOUNTER — Ambulatory Visit
Admission: RE | Admit: 2015-06-27 | Discharge: 2015-06-27 | Disposition: A | Discharge status: Home / Self Care | Payer: BLUE CROSS/BLUE SHIELD | Source: Ambulatory Visit | Attending: Surgery | Admitting: Surgery

## 2015-06-27 ENCOUNTER — Ambulatory Visit (HOSPITAL_BASED_OUTPATIENT_CLINIC_OR_DEPARTMENT_OTHER): Payer: BLUE CROSS/BLUE SHIELD | Admitting: Anesthesiology

## 2015-06-27 DIAGNOSIS — C50911 Malignant neoplasm of unspecified site of right female breast: Secondary | ICD-10-CM | POA: Diagnosis not present

## 2015-06-27 DIAGNOSIS — R200 Anesthesia of skin: Secondary | ICD-10-CM | POA: Diagnosis not present

## 2015-06-27 DIAGNOSIS — Z803 Family history of malignant neoplasm of breast: Secondary | ICD-10-CM | POA: Insufficient documentation

## 2015-06-27 HISTORY — PX: RADIOACTIVE SEED GUIDED PARTIAL MASTECTOMY/AXILLARY SENTINEL NODE BIOPSY/AXILLARY NODE DISSECTION: SHX6491

## 2015-06-27 HISTORY — PX: RADIOACTIVE SEED GUIDED PARTIAL MASTECTOMY WITH AXILLARY SENTINEL LYMPH NODE BIOPSY: SHX6520

## 2015-06-27 SURGERY — RADIOACTIVE SEED GUIDED PARTIAL MASTECTOMY WITH AXILLARY SENTINEL LYMPH NODE BIOPSY
Anesthesia: General | Site: Breast | Laterality: Right

## 2015-06-27 SURGERY — RADIOACTIVE SEED GUIDED PARTIAL MASTECTOMY WITH AXILLARY SENTINEL LYMPH NODE BIOPSY AND AXILLARY LYMPH NODE DISSECTION
Anesthesia: Regional | Site: Breast | Laterality: Right

## 2015-06-27 MED ORDER — FENTANYL CITRATE (PF) 100 MCG/2ML IJ SOLN
INTRAMUSCULAR | Status: AC
  Filled 2015-06-27: qty 2

## 2015-06-27 MED ORDER — ONDANSETRON HCL 4 MG/2ML IJ SOLN
INTRAMUSCULAR | Status: DC | PRN
  Administered 2015-06-27: 4 mg via INTRAVENOUS

## 2015-06-27 MED ORDER — BUPIVACAINE-EPINEPHRINE (PF) 0.25% -1:200000 IJ SOLN
INTRAMUSCULAR | Status: DC | PRN
  Administered 2015-06-27: 20 mL

## 2015-06-27 MED ORDER — BUPIVACAINE-EPINEPHRINE (PF) 0.5% -1:200000 IJ SOLN
INTRAMUSCULAR | Status: DC | PRN
  Administered 2015-06-27: 25 mL

## 2015-06-27 MED ORDER — CEFAZOLIN SODIUM-DEXTROSE 2-4 GM/100ML-% IV SOLN
INTRAVENOUS | Status: AC
  Filled 2015-06-27: qty 100

## 2015-06-27 MED ORDER — LIDOCAINE HCL (CARDIAC) 20 MG/ML IV SOLN
INTRAVENOUS | Status: DC | PRN
  Administered 2015-06-27: 50 mg via INTRAVENOUS

## 2015-06-27 MED ORDER — KETOROLAC TROMETHAMINE 30 MG/ML IJ SOLN: 30.0000 mg | Freq: Once | INTRAMUSCULAR | Status: DC | PRN

## 2015-06-27 MED ORDER — SUFENTANIL CITRATE 50 MCG/ML IV SOLN
INTRAVENOUS | Status: AC
  Filled 2015-06-27: qty 1

## 2015-06-27 MED ORDER — DEXAMETHASONE SODIUM PHOSPHATE 4 MG/ML IJ SOLN
INTRAMUSCULAR | Status: DC | PRN
  Administered 2015-06-27: 10 mg via INTRAVENOUS

## 2015-06-27 MED ORDER — METHYLENE BLUE 0.5 % INJ SOLN
INTRAVENOUS | Status: AC
  Filled 2015-06-27: qty 10

## 2015-06-27 MED ORDER — TECHNETIUM TC 99M SULFUR COLLOID FILTERED
1.0000 | Freq: Once | INTRAVENOUS | Status: AC | PRN
  Administered 2015-06-27: 1 via INTRADERMAL

## 2015-06-27 MED ORDER — DEXAMETHASONE SODIUM PHOSPHATE 10 MG/ML IJ SOLN
INTRAMUSCULAR | Status: AC
  Filled 2015-06-27: qty 1

## 2015-06-27 MED ORDER — MIDAZOLAM HCL 2 MG/2ML IJ SOLN
1.0000 mg | INTRAMUSCULAR | Status: DC | PRN
  Administered 2015-06-27: 2 mg via INTRAVENOUS
  Administered 2015-06-27: 1 mg via INTRAVENOUS

## 2015-06-27 MED ORDER — SUFENTANIL CITRATE 50 MCG/ML IV SOLN
INTRAVENOUS | Status: DC | PRN
  Administered 2015-06-27: 5 ug via INTRAVENOUS
  Administered 2015-06-27: 10 ug via INTRAVENOUS

## 2015-06-27 MED ORDER — SODIUM CHLORIDE 0.9 % IJ SOLN
INTRAMUSCULAR | Status: AC
  Filled 2015-06-27: qty 10

## 2015-06-27 MED ORDER — FENTANYL CITRATE (PF) 100 MCG/2ML IJ SOLN
50.0000 ug | INTRAMUSCULAR | Status: DC | PRN
Start: 2015-06-27 — End: 2015-06-27
  Administered 2015-06-27: 50 ug via INTRAVENOUS

## 2015-06-27 MED ORDER — MIDAZOLAM HCL 2 MG/2ML IJ SOLN
INTRAMUSCULAR | Status: AC
  Filled 2015-06-27: qty 2

## 2015-06-27 MED ORDER — ONDANSETRON HCL 4 MG/2ML IJ SOLN
INTRAMUSCULAR | Status: AC
  Filled 2015-06-27: qty 2

## 2015-06-27 MED ORDER — PHENYLEPHRINE 40 MCG/ML (10ML) SYRINGE FOR IV PUSH (FOR BLOOD PRESSURE SUPPORT)
PREFILLED_SYRINGE | INTRAVENOUS | Status: AC
  Filled 2015-06-27: qty 10

## 2015-06-27 MED ORDER — CHLORHEXIDINE GLUCONATE 4 % EX LIQD: 1.0000 "application " | Freq: Once | CUTANEOUS | Status: DC

## 2015-06-27 MED ORDER — LACTATED RINGERS IV SOLN
INTRAVENOUS | Status: DC
  Administered 2015-06-27 (×2): via INTRAVENOUS

## 2015-06-27 MED ORDER — EPHEDRINE SULFATE 50 MG/ML IJ SOLN
INTRAMUSCULAR | Status: AC
  Filled 2015-06-27: qty 1

## 2015-06-27 MED ORDER — HYDROMORPHONE HCL 1 MG/ML IJ SOLN: 0.2500 mg | INTRAMUSCULAR | Status: DC | PRN

## 2015-06-27 MED ORDER — SUCCINYLCHOLINE CHLORIDE 20 MG/ML IJ SOLN
INTRAMUSCULAR | Status: AC
  Filled 2015-06-27: qty 1

## 2015-06-27 MED ORDER — SCOPOLAMINE 1 MG/3DAYS TD PT72: 1.0000 | MEDICATED_PATCH | Freq: Once | TRANSDERMAL | Status: DC | PRN

## 2015-06-27 MED ORDER — GLYCOPYRROLATE 0.2 MG/ML IJ SOLN: 0.2000 mg | Freq: Once | INTRAMUSCULAR | Status: DC | PRN

## 2015-06-27 MED ORDER — BUPIVACAINE-EPINEPHRINE (PF) 0.5% -1:200000 IJ SOLN
INTRAMUSCULAR | Status: AC
Start: 2015-06-27 — End: 2015-06-27
  Filled 2015-06-27: qty 30

## 2015-06-27 MED ORDER — ATROPINE SULFATE 0.4 MG/ML IJ SOLN
INTRAMUSCULAR | Status: AC
  Filled 2015-06-27: qty 1

## 2015-06-27 MED ORDER — PROPOFOL 10 MG/ML IV BOLUS
INTRAVENOUS | Status: DC | PRN
  Administered 2015-06-27: 150 mg via INTRAVENOUS

## 2015-06-27 MED ORDER — HYDROCODONE-ACETAMINOPHEN 5-325 MG PO TABS: 1.0000 | ORAL_TABLET | Freq: Four times a day (QID) | ORAL | Status: DC | PRN

## 2015-06-27 MED ORDER — PROPOFOL 10 MG/ML IV BOLUS
INTRAVENOUS | Status: AC
  Filled 2015-06-27: qty 20

## 2015-06-27 MED ORDER — PROMETHAZINE HCL 25 MG/ML IJ SOLN: 6.2500 mg | INTRAMUSCULAR | Status: DC | PRN

## 2015-06-27 MED ORDER — CEFAZOLIN SODIUM-DEXTROSE 2-4 GM/100ML-% IV SOLN
2.0000 g | INTRAVENOUS | Status: AC
  Administered 2015-06-27: 2 g via INTRAVENOUS

## 2015-06-27 MED ORDER — HYDROCODONE-ACETAMINOPHEN 7.5-325 MG PO TABS: 1.0000 | ORAL_TABLET | Freq: Once | ORAL | Status: DC | PRN

## 2015-06-27 MED ORDER — PROPOFOL 500 MG/50ML IV EMUL
INTRAVENOUS | Status: AC
  Filled 2015-06-27: qty 50

## 2015-06-27 MED ORDER — LIDOCAINE HCL (CARDIAC) 20 MG/ML IV SOLN
INTRAVENOUS | Status: AC
  Filled 2015-06-27: qty 5

## 2015-06-27 SURGICAL SUPPLY — 52 items
APL SKNCLS STERI-STRIP NONHPOA (GAUZE/BANDAGES/DRESSINGS)
BENZOIN TINCTURE PRP APPL 2/3 (GAUZE/BANDAGES/DRESSINGS) IMPLANT
BINDER BREAST LRG (GAUZE/BANDAGES/DRESSINGS) IMPLANT
BINDER BREAST MEDIUM (GAUZE/BANDAGES/DRESSINGS) IMPLANT
BINDER BREAST XLRG (GAUZE/BANDAGES/DRESSINGS) IMPLANT
BINDER BREAST XXLRG (GAUZE/BANDAGES/DRESSINGS) IMPLANT
BLADE HEX COATED 2.75 (ELECTRODE) ×2 IMPLANT
BLADE SURG 10 STRL SS (BLADE) ×2 IMPLANT
BLADE SURG 15 STRL LF DISP TIS (BLADE) ×1 IMPLANT
BLADE SURG 15 STRL SS (BLADE) ×2
CANISTER SUC SOCK COL 7IN (MISCELLANEOUS) ×2 IMPLANT
CANISTER SUCT 1200ML W/VALVE (MISCELLANEOUS) ×2 IMPLANT
CHLORAPREP W/TINT 26ML (MISCELLANEOUS) ×2 IMPLANT
CLIP TI WIDE RED SMALL 6 (CLIP) ×2 IMPLANT
COVER BACK TABLE 60X90IN (DRAPES) ×2 IMPLANT
COVER MAYO STAND STRL (DRAPES) ×2 IMPLANT
COVER PROBE W GEL 5X96 (DRAPES) ×2 IMPLANT
DECANTER SPIKE VIAL GLASS SM (MISCELLANEOUS) IMPLANT
DEVICE DUBIN W/COMP PLATE 8390 (MISCELLANEOUS) ×2 IMPLANT
DRAPE LAPAROSCOPIC ABDOMINAL (DRAPES) ×2 IMPLANT
DRAPE UTILITY XL STRL (DRAPES) ×2 IMPLANT
DRSG PAD ABDOMINAL 8X10 ST (GAUZE/BANDAGES/DRESSINGS) IMPLANT
ELECT COATED BLADE 2.86 ST (ELECTRODE) ×2 IMPLANT
ELECT REM PT RETURN 9FT ADLT (ELECTROSURGICAL) ×2
ELECTRODE REM PT RTRN 9FT ADLT (ELECTROSURGICAL) ×1 IMPLANT
GAUZE SPONGE 4X4 12PLY STRL (GAUZE/BANDAGES/DRESSINGS) ×2 IMPLANT
GLOVE SURG SIGNA 7.5 PF LTX (GLOVE) ×2 IMPLANT
GOWN STRL REUS W/ TWL LRG LVL3 (GOWN DISPOSABLE) ×1 IMPLANT
GOWN STRL REUS W/ TWL XL LVL3 (GOWN DISPOSABLE) ×1 IMPLANT
GOWN STRL REUS W/TWL LRG LVL3 (GOWN DISPOSABLE) ×2
GOWN STRL REUS W/TWL XL LVL3 (GOWN DISPOSABLE) ×2
KIT MARKER MARGIN INK (KITS) ×1 IMPLANT
LIQUID BAND (GAUZE/BANDAGES/DRESSINGS) IMPLANT
NDL HYPO 25X1 1.5 SAFETY (NEEDLE) ×1 IMPLANT
NDL SAFETY ECLIPSE 18X1.5 (NEEDLE) IMPLANT
NEEDLE HYPO 18GX1.5 SHARP (NEEDLE)
NEEDLE HYPO 25X1 1.5 SAFETY (NEEDLE) ×2 IMPLANT
NS IRRIG 1000ML POUR BTL (IV SOLUTION) ×2 IMPLANT
PACK BASIN DAY SURGERY FS (CUSTOM PROCEDURE TRAY) ×2 IMPLANT
PENCIL BUTTON HOLSTER BLD 10FT (ELECTRODE) ×2 IMPLANT
SHEET MEDIUM DRAPE 40X70 STRL (DRAPES) ×2 IMPLANT
SLEEVE SCD COMPRESS KNEE MED (MISCELLANEOUS) ×2 IMPLANT
SPONGE GAUZE 4X4 12PLY STER LF (GAUZE/BANDAGES/DRESSINGS) IMPLANT
SPONGE LAP 18X18 X RAY DECT (DISPOSABLE) IMPLANT
STRIP CLOSURE SKIN 1/4X4 (GAUZE/BANDAGES/DRESSINGS) IMPLANT
SUT MNCRL AB 4-0 PS2 18 (SUTURE) IMPLANT
SUT VICRYL 3-0 CR8 SH (SUTURE) ×1 IMPLANT
SYR CONTROL 10ML LL (SYRINGE) ×2 IMPLANT
TOWEL OR 17X24 6PK STRL BLUE (TOWEL DISPOSABLE) ×2 IMPLANT
TOWEL OR NON WOVEN STRL DISP B (DISPOSABLE) ×2 IMPLANT
TUBE CONNECTING 20X1/4 (TUBING) ×2 IMPLANT
YANKAUER SUCT BULB TIP NO VENT (SUCTIONS) ×2 IMPLANT

## 2015-06-27 SURGICAL SUPPLY — 67 items
APL SKNCLS STERI-STRIP NONHPOA (GAUZE/BANDAGES/DRESSINGS)
APPLIER CLIP 11 MED OPEN (CLIP) ×2
APPLIER CLIP 9.375 MED OPEN (MISCELLANEOUS)
APR CLP MED 11 20 MLT OPN (CLIP) ×1
APR CLP MED 9.3 20 MLT OPN (MISCELLANEOUS)
BENZOIN TINCTURE PRP APPL 2/3 (GAUZE/BANDAGES/DRESSINGS) IMPLANT
BINDER BREAST LRG (GAUZE/BANDAGES/DRESSINGS) IMPLANT
BINDER BREAST MEDIUM (GAUZE/BANDAGES/DRESSINGS) ×1 IMPLANT
BINDER BREAST XLRG (GAUZE/BANDAGES/DRESSINGS) IMPLANT
BINDER BREAST XXLRG (GAUZE/BANDAGES/DRESSINGS) IMPLANT
BLADE HEX COATED 2.75 (ELECTRODE) ×2 IMPLANT
BLADE SURG 10 STRL SS (BLADE) ×2 IMPLANT
BLADE SURG 15 STRL LF DISP TIS (BLADE) ×1 IMPLANT
BLADE SURG 15 STRL SS (BLADE) ×2
CANISTER SUC SOCK COL 7IN (MISCELLANEOUS) ×2 IMPLANT
CANISTER SUCT 1200ML W/VALVE (MISCELLANEOUS) ×2 IMPLANT
CHLORAPREP W/TINT 26ML (MISCELLANEOUS) ×2 IMPLANT
CLIP APPLIE 11 MED OPEN (CLIP) ×1 IMPLANT
CLIP APPLIE 9.375 MED OPEN (MISCELLANEOUS) IMPLANT
CLIP TI WIDE RED SMALL 6 (CLIP) ×2 IMPLANT
COVER BACK TABLE 60X90IN (DRAPES) ×2 IMPLANT
COVER MAYO STAND STRL (DRAPES) ×2 IMPLANT
COVER PROBE W GEL 5X96 (DRAPES) ×2 IMPLANT
DECANTER SPIKE VIAL GLASS SM (MISCELLANEOUS) ×1 IMPLANT
DEVICE DUBIN W/COMP PLATE 8390 (MISCELLANEOUS) ×2 IMPLANT
DRAIN CHANNEL 19F RND (DRAIN) IMPLANT
DRAIN HEMOVAC 1/8 X 5 (WOUND CARE) IMPLANT
DRAPE LAPAROSCOPIC ABDOMINAL (DRAPES) ×2 IMPLANT
DRAPE UTILITY XL STRL (DRAPES) ×2 IMPLANT
DRSG PAD ABDOMINAL 8X10 ST (GAUZE/BANDAGES/DRESSINGS) IMPLANT
ELECT COATED BLADE 2.86 ST (ELECTRODE) ×2 IMPLANT
ELECT REM PT RETURN 9FT ADLT (ELECTROSURGICAL) ×2
ELECTRODE REM PT RTRN 9FT ADLT (ELECTROSURGICAL) ×1 IMPLANT
EVACUATOR SILICONE 100CC (DRAIN) IMPLANT
GAUZE SPONGE 4X4 12PLY STRL (GAUZE/BANDAGES/DRESSINGS) ×2 IMPLANT
GLOVE BIOGEL PI IND STRL 7.5 (GLOVE) IMPLANT
GLOVE BIOGEL PI IND STRL 8 (GLOVE) IMPLANT
GLOVE BIOGEL PI INDICATOR 7.5 (GLOVE) ×1
GLOVE BIOGEL PI INDICATOR 8 (GLOVE) ×1
GLOVE SURG SIGNA 7.5 PF LTX (GLOVE) ×2 IMPLANT
GLOVE SURG SS PI 7.0 STRL IVOR (GLOVE) ×1 IMPLANT
GOWN STRL REUS W/ TWL LRG LVL3 (GOWN DISPOSABLE) ×1 IMPLANT
GOWN STRL REUS W/ TWL XL LVL3 (GOWN DISPOSABLE) ×1 IMPLANT
GOWN STRL REUS W/TWL LRG LVL3 (GOWN DISPOSABLE) ×2
GOWN STRL REUS W/TWL XL LVL3 (GOWN DISPOSABLE) ×2
KIT MARKER MARGIN INK (KITS) ×2 IMPLANT
LIQUID BAND (GAUZE/BANDAGES/DRESSINGS) IMPLANT
NDL HYPO 25X1 1.5 SAFETY (NEEDLE) ×1 IMPLANT
NDL SAFETY ECLIPSE 18X1.5 (NEEDLE) IMPLANT
NEEDLE HYPO 18GX1.5 SHARP (NEEDLE)
NEEDLE HYPO 25X1 1.5 SAFETY (NEEDLE) ×2 IMPLANT
NS IRRIG 1000ML POUR BTL (IV SOLUTION) ×2 IMPLANT
PACK BASIN DAY SURGERY FS (CUSTOM PROCEDURE TRAY) ×2 IMPLANT
PENCIL BUTTON HOLSTER BLD 10FT (ELECTRODE) ×2 IMPLANT
SHEET MEDIUM DRAPE 40X70 STRL (DRAPES) ×2 IMPLANT
SLEEVE SCD COMPRESS KNEE MED (MISCELLANEOUS) ×2 IMPLANT
SPONGE GAUZE 4X4 12PLY STER LF (GAUZE/BANDAGES/DRESSINGS) ×1 IMPLANT
SPONGE LAP 18X18 X RAY DECT (DISPOSABLE) IMPLANT
STRIP CLOSURE SKIN 1/4X4 (GAUZE/BANDAGES/DRESSINGS) IMPLANT
SUT ETHILON 2 0 FS 18 (SUTURE) IMPLANT
SUT MNCRL AB 4-0 PS2 18 (SUTURE) ×2 IMPLANT
SUT VICRYL 3-0 CR8 SH (SUTURE) ×2 IMPLANT
SYR CONTROL 10ML LL (SYRINGE) ×2 IMPLANT
TOWEL OR 17X24 6PK STRL BLUE (TOWEL DISPOSABLE) ×2 IMPLANT
TOWEL OR NON WOVEN STRL DISP B (DISPOSABLE) ×2 IMPLANT
TUBE CONNECTING 20X1/4 (TUBING) ×2 IMPLANT
YANKAUER SUCT BULB TIP NO VENT (SUCTIONS) ×2 IMPLANT

## 2015-06-27 NOTE — Transfer of Care (Signed)
Immediate Anesthesia Transfer of Care Note  Patient: Taylor Zamora  Procedure(s) Performed: Procedure(s): RADIOACTIVE SEED GUIDED PARTIAL MASTECTOMY WITH AXILLARY SENTINEL LYMPH NODE BIOPSY AND AXILLARY LYMPH NODE DISSECTION (Right)  Patient Location: PACU  Anesthesia Type:GA combined with regional for post-op pain  Level of Consciousness: awake, alert  and oriented  Airway & Oxygen Therapy: Patient Spontanous Breathing and Patient connected to face mask oxygen  Post-op Assessment: Report given to RN and Post -op Vital signs reviewed and stable  Post vital signs: Reviewed and stable  Last Vitals:  Filed Vitals:   06/27/15 1015 06/27/15 1016  BP: 105/59   Pulse: 63 61  Temp:    Resp: 16 17    Complications: No apparent anesthesia complications

## 2015-06-27 NOTE — Anesthesia Postprocedure Evaluation (Signed)
Anesthesia Post Note  Patient: Taylor Zamora  Procedure(s) Performed: Procedure(s) (LRB): RADIOACTIVE SEED GUIDED PARTIAL MASTECTOMY WITH AXILLARY SENTINEL LYMPH NODE BIOPSY AND AXILLARY LYMPH NODE DISSECTION (Right)  Patient location during evaluation: PACU Anesthesia Type: General Level of consciousness: awake and alert Pain management: pain level controlled Vital Signs Assessment: post-procedure vital signs reviewed and stable Respiratory status: spontaneous breathing, nonlabored ventilation and respiratory function stable Cardiovascular status: blood pressure returned to baseline and stable Postop Assessment: no signs of nausea or vomiting Anesthetic complications: no    Last Vitals:  Filed Vitals:   06/27/15 1315 06/27/15 1321  BP: 125/82   Pulse: 62 86  Temp:    Resp: 14 17    Last Pain:  Filed Vitals:   06/27/15 1324  PainSc: 4                  Tiajuana Amass

## 2015-06-27 NOTE — Anesthesia Procedure Notes (Addendum)
Anesthesia Regional Block:  Pectoralis block  Pre-Anesthetic Checklist: ,, timeout performed, Correct Patient, Correct Site, Correct Laterality, Correct Procedure, Correct Position, site marked, Risks and benefits discussed,  Surgical consent,  Pre-op evaluation,  At surgeon's request and post-op pain management  Laterality: Right  Prep: chloraprep       Needles:   Needle Type: Echogenic Needle     Needle Length: 9cm 9 cm Needle Gauge: 21 and 21 G    Additional Needles:  Procedures: ultrasound guided (picture in chart) Pectoralis block Narrative:  Start time: 06/27/2015 10:00 AM End time: 06/27/2015 10:10 AM Injection made incrementally with aspirations every 5 mL.  Performed by: Personally  Anesthesiologist: Suzette Battiest   Procedure Name: LMA Insertion Date/Time: 06/27/2015 11:12 AM Performed by: Melynda Ripple D Pre-anesthesia Checklist: Patient identified, Emergency Drugs available, Suction available and Patient being monitored Patient Re-evaluated:Patient Re-evaluated prior to inductionOxygen Delivery Method: Circle System Utilized Preoxygenation: Pre-oxygenation with 100% oxygen Intubation Type: IV induction Ventilation: Mask ventilation without difficulty LMA: LMA inserted LMA Size: 4.0 Number of attempts: 1 Airway Equipment and Method: Bite block Placement Confirmation: positive ETCO2 Tube secured with: Tape Dental Injury: Teeth and Oropharynx as per pre-operative assessment

## 2015-06-27 NOTE — Interval H&P Note (Signed)
History and Physical Interval Note:  06/27/2015 10:53 AM  Taylor Zamora  has presented today for surgery, with the diagnosis of RIGHT BREAST CANCER  The various methods of treatment have been discussed with the patient and family. The seed is in the correct location.  Her husband is here with her.  After consideration of risks, benefits and other options for treatment, the patient has consented to  Procedure(s): RADIOACTIVE SEED GUIDED PARTIAL MASTECTOMY WITH AXILLARY SENTINEL LYMPH NODE BIOPSY AND AXILLARY LYMPH NODE DISSECTION (Right) as a surgical intervention .  The patient's history has been reviewed, patient examined, no change in status, stable for surgery.  I have reviewed the patient's chart and labs.  Questions were answered to the patient's satisfaction.     Achilles Neville H

## 2015-06-27 NOTE — Op Note (Addendum)
06/27/2015  12:25 PM  PATIENT:  Taylor Zamora DOB: 10/18/1965 MRN: [id number]  PREOP DIAGNOSIS:  RIGHT BREAST CANCER  POSTOP DIAGNOSIS:   Right breast cancer, 12 o'clock position (T1, N0)  PROCEDURE:   Procedure(s): RADIOACTIVE SEED GUIDED Right PARTIAL MASTECTOMY WITH Right AXILLARY SENTINEL LYMPH NODE BIOPSY   SURGEON:   Alphonsa Overall, M.D.  ANESTHESIA:   general  Anesthesiologist: Suzette Battiest, MD CRNA: Willa Frater, CRNA  General  EBL:  < 50  ml  DRAINS: none   LOCAL MEDICATIONS USED:   Right pectoral block and 20 cc 1/4% marcaine  SPECIMEN:   Right breast lumpectomy (suture medial) and right axillary sentinel lymph node biopsy (counts 200, background 0)  COUNTS CORRECT:  YES  INDICATIONS FOR PROCEDURE:  Taylor Zamora is a 50 y.o. (DOB: 10-Jun-1965) Hispanic  female whose primary care physician is FULP, CAMMIE, MD and comes for right breast lumpectomy and right axillary sentinel lymph node biopsy.   She was seen in the Breast [facility] clinic with Drs. Magrinat and Kinard.  Her surgery was delayed from last week, because she ate the day of surgery.   The options for breast cancer treatment have been discussed with the patient. She elected to proceed with lumpectomy and axillary sentinel lymph node.     The indications and potential complications of surgery were explained to the patient. Potential complications include, but are not limited to, bleeding, infection, the need for further surgery, and nerve injury.     She had a I131 seed placed on 06/22/2015 in her right breast at the [facility].  I confirmed the presence of the I131 seed in the pre op area using the Neoprobe.  The seed is in the 12 o'clock position of the right breast.  OPERATIVE NOTE:   The patient was taken to room # 4 at [facility] where she underwent a general anesthesia  supervised by Anesthesiologist: Suzette Battiest, MD CRNA: Willa Frater, CRNA. Her right breast and axilla were  prepped with  ChloraPrep and sterilely draped.    A time-out and the surgical check list was reviewed.    I started with the dissection of the right breast cancer which was about at the 12 o'clock position of the right breast.   I used the Neoprobe to identify the I131 seed.  I tried to excise an area around the tumor of at least 1 cm.    I excised this block of breast tissue approximately 4 cm by 4 cm  in diameter.  I placed a suture medially in the specimen.   I painted the lumpectomy specimen with the 6 color paint kit and did a specimen mammogram which confirmed the mass, clip, and the seed were all in the right position in the specimen.  The specimen was sent to pathology who called back to confirm that they have the seed and the specimen.   I then started the right axillary sentinel lymph node biopsy. I made an incision in the right axilla.  I found a hot area at the junction of the breast and the pectoralis major muscle. I cut down and  identified a hot node that had counts of 200 and the background has 0 counts.  I checked her internal mammary nodes and supraclavicular nodes with the neoprobe and found no other hot area. The axillary node was then sent to pathology.    I then irrigated the wound with saline. I infiltrated approximately 20 mL of 1/4%  marcaine between the incisions. She had a pectoral block before the surgery.  I placed 6 clips to mark biopsy cavity, at 12, 3, 6, and 9 o'clock. Two clips were placed on the pectoralis major.   I then closed all the wounds in layers using 3-0 Vicryl sutures for the deep layer. At the skin, I closed the incisions with a 4-0 Monocryl suture. The incisions were then painted with LiquiBand.  She had gauze place over the wounds and placed in a breast binder.   The patient tolerated the procedure well, was transported to the recovery room in good condition. Sponge and needle count were correct at the end of the case.   Final pathology is pending.   Alphonsa Overall, MD, [facility] Pager: [phone] Office phone:  [phone]

## 2015-06-27 NOTE — Progress Notes (Signed)
Assisted Dr. Rob Fitzgerald with right, ultrasound guided, pectoralis block. Side rails up, monitors on throughout procedure. See vital signs in flow sheet. Tolerated Procedure well. 

## 2015-06-27 NOTE — Discharge Instructions (Signed)
[facility] - DISCHARGE INSTRUCTIONS TO PATIENT  Activity:  Driving - May drive in 2 or 3 days, if doing well   Lifting - No lifting more than 15 pounds for 1 week, then no limit  Wound Care:   Leave bandage for 2 days.  Then remove bandage and shower.  Diet:  As tolerated  Follow up appointment:  Call Dr. Pollie Friar office [facility]) at [phone] for an appointment in 2 weeks.  Medications and dosages:  Resume your home medications.  You have a prescription for:  Vicodin  Call Dr. Lucia Gaskins or his office  [phone]) if you have:  Temperature greater than 100.4,  Persistent nausea and vomiting,  Severe uncontrolled pain,  Redness, tenderness, or signs of infection (pain, swelling, redness, odor or green/yellow discharge around the site),  Difficulty breathing, headache or visual disturbances,  Any other questions or concerns you may have after discharge.  In an emergency, call 911 or go to an Emergency Department at a nearby hospital.   Post Anesthesia Home Care Instructions  Activity: Get plenty of rest for the remainder of the day. A responsible adult should stay with you for 24 hours following the procedure.  For the next 24 hours, DO NOT: -Drive a car -Paediatric nurse -Drink alcoholic beverages -Take any medication unless instructed by your physician -Make any legal decisions or sign important papers.  Meals: Start with liquid foods such as gelatin or soup. Progress to regular foods as tolerated. Avoid greasy, spicy, heavy foods. If nausea and/or vomiting occur, drink only clear liquids until the nausea and/or vomiting subsides. Call your physician if vomiting continues.  Special Instructions/Symptoms: Your throat may feel dry or sore from the anesthesia or the breathing tube placed in your throat during surgery. If this causes discomfort, gargle with warm salt water. The discomfort should disappear within 24 hours.  If you had a  scopolamine patch placed behind your ear for the management of post- operative nausea and/or vomiting:  1. The medication in the patch is effective for 72 hours, after which it should be removed.  Wrap patch in a tissue and discard in the trash. Wash hands thoroughly with soap and water. 2. You may remove the patch earlier than 72 hours if you experience unpleasant side effects which may include dry mouth, dizziness or visual disturbances. 3. Avoid touching the patch. Wash your hands with soap and water after contact with the patch.

## 2015-06-27 NOTE — H&P (View-Only) (Signed)
[facility]" Taylor Zamora  Location: [facility] Patient #: [id number] DOB: Feb 27, 1966 Undefined / Language: Taylor Zamora / Race: Undefined Female  History of Present Illness   The patient is a 50 year old female who presents with breast cancer.   Goes by "Taylor Zamora"  Her PCP is Dr. Antony Zamora  She is at the [facility] - Drs. Taylor Zamora/Taylor Zamora  She is here with her husband, Taylor Zamora.   Her last mammogram was about 1 year ago. She had noticed some tenderness in the upper portions of both breast, but no specific mass. Her last period was December 2016. She is on no hormonal therapy. She has a mother's cousinwho had breast cancer.   She underwent a mammogram at [facility] on 02/23/2015 which showed a suspicious area of distortion in the superior right breast on mammography corresponds with an ill-defined shadowing mass at 1230. A right breast biopsy on 04/29/2015 [phone]) showed a grade II IDC, ER - 80%, PR - 90%, Ki67- 5%, Her2Neu - neg.   I discussed the options for breast cancer treatment with the patient. She is in the Breast multidisciplinary clinic, which includes medical oncology and radiation oncology. I discussed the surgical options of lumpectomy vs. mastectomy. If mastectomy, there is the possibility of reconstruction. I discussed the options of lymph node biopsy. The treatment plan depends on the pathologic staging of the tumor and the patient's personal wishes. The risks of surgery include, but are not limited to, bleeding, infection, the need for further surgery, and nerve injury. The patient has been given literature on the treatment of breast cancer.  Plan: 1) Right breast (seed loc) lumpectomy and right axillary SLNBx, 2) Genetics (this was questioned, because she has only one remote cousin with cancer), 3) Oncotype  Past Medical History: 1) Fatigued - but not sure why (but she is only sleeping 2 to 3 hours per night at a  time) 2) Right arm numbness 3) Her neck bothers her - she feels tension 4) Bloating, occasionally  Social History: She is here with her husband, Taylor Zamora. 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 Caffeine use Coffee. No drug use Tobacco use Never smoker.  Family History Taylor Slipper, RN; 05/11/2015 7:44 AM) Diabetes Mellitus Mother. Hypertension Mother.  Pregnancy / Birth History Taylor Slipper, RN; 05/11/2015 7:44 AM) Age at menarche 35 years. Contraceptive History Intrauterine device, Oral contraceptives. Gravida 3 Irregular periods Maternal age 38-35 Para 2  Review of Systems Taylor Slipper RN; 05/11/2015 7:44 AM) General Present- Fatigue and Night Sweats. Not Present- Appetite Loss, Chills, Fever, Weight Gain and Weight Loss. Skin Not Present- Change in Wart/Mole, Dryness, Hives, Jaundice, New Lesions, Non-Healing Wounds, Rash and Ulcer. HEENT Present- Seasonal Allergies and Wears glasses/contact lenses. Not Present- Earache, Hearing Loss, Hoarseness, Nose Bleed, Oral Ulcers, Ringing in the Ears, Sinus Pain, Sore Throat, Visual Disturbances and Yellow Eyes. Respiratory Not Present- Bloody sputum, Chronic Cough, Difficulty Breathing, Snoring and Wheezing. Breast Present- Breast Pain.  Not Present- Breast Mass, Nipple Discharge and Skin Changes. Cardiovascular  Present- Difficulty Breathing Lying Down and Shortness of Breath. Not Present- Chest Pain, Leg Cramps, Palpitations, Rapid Heart Rate and Swelling of Extremities. Gastrointestinal Present- Abdominal Pain, Bloating and Indigestion. Not Present- Bloody Stool, Change in Bowel Habits, Chronic diarrhea, Constipation, Difficulty Swallowing, Excessive gas, Gets full quickly at meals, Hemorrhoids, Nausea, Rectal Pain and Vomiting. Female Genitourinary Not Present- Frequency, Nocturia, Painful Urination, Pelvic Pain and Urgency. Musculoskeletal Present- Joint Pain, Joint Stiffness and Muscle Pain. Not Present- Back Pain, Muscle Weakness and Swelling of Extremities. Neurological Present- Headaches, Numbness and Weakness. Not Present- Decreased Memory, Fainting, Seizures, Tingling, Tremor and Trouble walking. Psychiatric Not Present- Anxiety, Bipolar, Change in Sleep Pattern, Depression, Fearful and Frequent crying. Endocrine Present- Hot flashes. Not Present- Cold Intolerance, Excessive Hunger, Hair Changes, Heat Intolerance and New Diabetes. Hematology Not Present- Easy Bruising, Excessive bleeding, Gland problems, HIV and Persistent Infections.   Physical Exam  General: Hispanic WFalert and generally healthy appearing. HEENT: Normal. Pupils equal.  Neck: Supple. No mass. No thyroid mass.  Lymph Nodes: No supraclavicular, cervical, or axillary nodes.  Lungs: Clear to auscultation and symmetric breath sounds. Heart: RRR. No murmur or rub.  Breasts: Right - Puncture site at 10 o'clock, but I do not feel a mass or nodule Left - No mass  Abdomen: Soft. No mass. No tenderness. No hernia. Normal bowel sounds.   Extremities: Good strength and ROM in upper and lower extremities.  Neurologic: Grossly intact to motor and sensory function. Psychiatric: Has normal mood and affect. Behavior is normal.  Assessment & Plan  1.  BREAST CANCER, STAGE 1, RIGHT (C50.911)  Story: A right breast  biopsy on 04/29/2015 ([id number]) showed a grade II IDC, ER - 80%, PR - 90%, Ki67- 5%, Her2Neu - neg.   Oncology - Drs. Taylor Zamora/Taylor Zamora  Plan:   1) Right breast lumpectomy (seed localization) and right axillary SLNBx,   2) Genetics (this was questioned, because she has only one remote cousin with cancer),   3) Oncotype   4) They had questions about the cost and were interesed in financial counseling  2. Fatigued - but not sure why (but she is only sleeping 2 to 3 hours per night at a time) 3. Right arm numbness 4. Her neck bothers her - she feels tension   Alphonsa Overall, MD, [facility] Pager: [phone] Office phone:  [phone]

## 2015-06-27 NOTE — Anesthesia Preprocedure Evaluation (Signed)
Anesthesia Evaluation  Patient identified by MRN, date of birth, ID band Patient awake    Reviewed: Allergy & Precautions, NPO status , Patient's Chart, lab work & pertinent test results  Airway Mallampati: II  TM Distance: >3 FB Neck ROM: Full    Dental  (+) Dental Advisory Given   Pulmonary neg pulmonary ROS,    breath sounds clear to auscultation       Cardiovascular negative cardio ROS   Rhythm:Regular Rate:Normal     Neuro/Psych negative neurological ROS     GI/Hepatic negative GI ROS, Neg liver ROS,   Endo/Other  negative endocrine ROS  Renal/GU negative Renal ROS     Musculoskeletal negative musculoskeletal ROS (+)   Abdominal   Peds  Hematology negative hematology ROS (+)   Anesthesia Other Findings   Reproductive/Obstetrics                             Anesthesia Physical Anesthesia Plan  ASA: II  Anesthesia Plan: General and Regional   Post-op Pain Management: GA combined w/ Regional for post-op pain   Induction: Intravenous  Airway Management Planned: LMA  Additional Equipment:   Intra-op Plan:   Post-operative Plan: Extubation in OR  Informed Consent: I have reviewed the patients History and Physical, chart, labs and discussed the procedure including the risks, benefits and alternatives for the proposed anesthesia with the patient or authorized representative who has indicated his/her understanding and acceptance.   Dental advisory given  Plan Discussed with: CRNA  Anesthesia Plan Comments:         Anesthesia Quick Evaluation

## 2015-06-28 ENCOUNTER — Encounter (HOSPITAL_BASED_OUTPATIENT_CLINIC_OR_DEPARTMENT_OTHER): Payer: Self-pay | Admitting: Surgery

## 2015-06-28 NOTE — Addendum Note (Signed)
Addendum  created 06/28/15 [id number] by Tawni Millers, CRNA   Modules edited: Charges VN

## 2015-07-03 NOTE — Progress Notes (Signed)
Called patient and canceled appointments and she will need an Oncotype and results will not be in for 2 weeks. She was very agreeable.

## 2015-07-04 ENCOUNTER — Ambulatory Visit (HOSPITAL_BASED_OUTPATIENT_CLINIC_OR_DEPARTMENT_OTHER): Payer: BLUE CROSS/BLUE SHIELD | Admitting: Oncology

## 2015-07-04 ENCOUNTER — Encounter: Payer: Self-pay | Admitting: *Deleted

## 2015-07-04 DIAGNOSIS — C50211 Malignant neoplasm of upper-inner quadrant of right female breast: Secondary | ICD-10-CM

## 2015-07-04 NOTE — Progress Notes (Signed)
Ordered Oncotype Dx per [facility] request.  Faxed order to path & called Tammy to verify they received fax.  Faxed order to [facility].  Faxed insurance info to [employer].  Placed a note in task for results.

## 2015-07-05 ENCOUNTER — Other Ambulatory Visit: Payer: Self-pay | Admitting: Oncology

## 2015-07-13 ENCOUNTER — Encounter (HOSPITAL_COMMUNITY): Payer: Self-pay

## 2015-07-14 ENCOUNTER — Telehealth: Payer: Self-pay | Admitting: Oncology

## 2015-07-14 ENCOUNTER — Other Ambulatory Visit: Payer: Self-pay | Admitting: Oncology

## 2015-07-14 ENCOUNTER — Telehealth: Payer: Self-pay | Admitting: *Deleted

## 2015-07-14 DIAGNOSIS — C50211 Malignant neoplasm of upper-inner quadrant of right female breast: Secondary | ICD-10-CM

## 2015-07-14 NOTE — Telephone Encounter (Signed)
s.w. pt and cx 5.24 appt and moved to July pt ok and aware of new d.t

## 2015-07-14 NOTE — Progress Notes (Unsigned)
I calledVon and gave her the news on her Oncotype, which shows a risk of outside the breast recurrence in 10 years of 7% if her only systemic therapy is tamoxifen for 5 years. It also tells [place] that chemotherapy would not be of any help in her case.  Accordingly we are canceling her appointment with me May 24 and scheduling her with radiation oncology. She will see me in about 2 months by which time she should be able to start tamoxifen  She knows to call for any problems that may develop before the next visit.

## 2015-07-14 NOTE — Telephone Encounter (Signed)
Received Oncotype Dx results of 10/7%.  Emailed Dr. Jana Hakim w/ results.  Placed a copy in MD's box and took one to HIM to scan.

## 2015-07-18 NOTE — Progress Notes (Signed)
Location of Breast Cancer: Breast cancer of upper-inner quadrant of right female breast   Histology per Pathology Report:   06/27/15 Diagnosis 1. Breast, partial mastectomy, Right INVASIVE LOBULAR CARCINOMA, GRADE 1, SPANNING 2.1 CM LOBULAR CARCINOMA IN SITU IS PRESENT MARGINS OF RESECTION ARE NEGATIVE FOR INVASIVE CARCINOMA FIBROCYSTIC CHANGES WITH ASSOCIATED CALCIFICATIONS 2. Lymph node, sentinel, biopsy, Right Axillary ONE BENIGN LYMPH NODE (0/1)  Receptor Status: ER(80%), PR (90%), Her2-neu (negative)  Did patient present with symptoms (if so, please note symptoms) or was this found on screening mammography?: screening mammogram  Past/Anticipated interventions by surgeon, if any: 06/27/15 - Procedure: RADIOACTIVE SEED GUIDED PARTIAL MASTECTOMY WITH AXILLARY SENTINEL LYMPH NODE BIOPSY AND AXILLARY LYMPH NODE DISSECTION;  Surgeon: Alphonsa Overall, MD;  Location: [facility];  Service: General;  Laterality: Right;  Past/Anticipated interventions by medical oncology, if any: anti-estrogens to follow radiation  Lymphedema issues, if any:  no   Pain issues, if any:  yes  Has numbness, throbbing and burning of her right breast.  GYNECOLOGIC HISTORY:  Menarche age 16, first live birth age 8, She is [place] P2. She stopped having periods in December 2016 and used hormone replacement for approximately 2 months. She used oral contraceptives remotely for approximately 10 years.  SAFETY ISSUES:  Prior radiation? no  Pacemaker/ICD? no  Possible current pregnancy?no  Is the patient on methotrexate? no  Current Complaints / other details:    BP 115/69 mmHg  Pulse 60  Temp(Src) 98.1 F (36.7 C) (Oral)  Ht _0  (1.499 m)  Wt 132 lb 1.6 oz (59.92 kg)  BMI 26.67 kg/m2  LMP 07/07/2015   Wt Readings from Last 3 Encounters:  07/27/15 132 lb 1.6 oz (59.92 kg)  06/27/15 133 lb (60.328 kg)  06/23/15 132 lb (59.875 kg)      Jacqulyn Liner, RN 07/18/2015,3:04 PM

## 2015-07-27 ENCOUNTER — Ambulatory Visit
Admission: RE | Admit: 2015-07-27 | Discharge: 2015-07-27 | Disposition: A | Discharge status: Home / Self Care | Payer: BLUE CROSS/BLUE SHIELD | Source: Ambulatory Visit | Attending: Radiation Oncology | Admitting: Radiation Oncology

## 2015-07-27 ENCOUNTER — Ambulatory Visit: Payer: BLUE CROSS/BLUE SHIELD | Admitting: Oncology

## 2015-07-27 ENCOUNTER — Encounter: Payer: Self-pay | Admitting: Radiation Oncology

## 2015-07-27 VITALS — BP 115/69 | HR 60 | Temp 98.1°F | Ht 59.0 in | Wt 132.1 lb

## 2015-07-27 DIAGNOSIS — Z510 Encounter for antineoplastic radiation therapy: Secondary | ICD-10-CM | POA: Insufficient documentation

## 2015-07-27 DIAGNOSIS — Z170 Estrogen receptor positive status [ER+]: Secondary | ICD-10-CM | POA: Insufficient documentation

## 2015-07-27 DIAGNOSIS — C50211 Malignant neoplasm of upper-inner quadrant of right female breast: Secondary | ICD-10-CM

## 2015-07-27 NOTE — Progress Notes (Signed)
Radiation Oncology         [phone] ________________________________  Name: Taylor Zamora MRN: [id number]  Date: 07/27/2015  DOB: 08-31-1965  Re-Evaluation Visit Note  CC: Antony Blackbird, MD  Alphonsa Overall, MD  No diagnosis found.  Diagnosis: Stage IIA (pT2pN0) invasive lobular carcinoma of the right breast   Narrative:  The patient returns today for a re-evaluation. The patient underwent a right lumpectomy and sentinel lymph node biopsy on 06/27/15. This was positive for a pT2pN0, 2.1 cm, grade 1 invasive lobular carcinoma (ER/PR positive, HER2 negative, Ki67 5%). The margins were negative. A single biopsied axillary lymph node was negative. Her Oncotype Score was 10: Low. Chemotherapy was not recommended. The patient presents to me today to discuss the role of radiation in the management of her disease.  ROS: The patient reports numbness, throbbing, and burning of the right breast at her lumpectomy site.  ALLERGIES:  has No Known Allergies.  Meds: Current Outpatient Prescriptions  Medication Sig Dispense Refill  . Omega 3 1000 MG CAPS Take by mouth.    . Probiotic Product (PROBIOTIC PO) Take by mouth.    Marland Kitchen HYDROcodone-acetaminophen (NORCO/VICODIN) 5-325 MG tablet Take 1-2 tablets by mouth every 6 (six) hours as needed. (Patient not taking: Reported on 07/27/2015) 30 tablet 0  . Multiple Vitamin (MULTIVITAMIN) tablet Take 1 tablet by mouth daily. Reported on 07/27/2015     No current facility-administered medications for this encounter.    Physical Findings: The patient is in no acute distress. Patient is alert and oriented.  height is '4\' 11"'$  (1.499 m) and weight is 132 lb 1.6 oz (59.92 kg). Her oral temperature is 98.1 F (36.7 C). Her blood pressure is 115/69 and her pulse is 60.   Lungs are clear to auscultation bilaterally. Heart has regular rate and rhythm. No palpable cervical, supraclavicular, or axillary adenopathy. Abdomen soft, non-tender, normal bowel  sounds.  Well healing scar in the approximately 12:30 position of the right breast. She has a separate scar in her right axillary region from the sentinel procedure. No signs of infection or drainage.  Lab Findings: Lab Results  Component Value Date   WBC 5.3 05/11/2015   HGB 13.0 05/11/2015   HCT 37.5 05/11/2015   MCV 88.9 05/11/2015   PLT 270 05/11/2015    Radiographic Findings: No results found.  Impression: Stage IIA (pT2pN0) invasive lobular carcinoma of the right breast. She would be a good candidate for breast conservation therapy with radiation therapy directed at the right breast.  I spoke to the patient today regarding her diagnosis and options for treatment. We discussed the equivalence in terms of survival and local failure between mastectomy and breast conservation. We discussed the role of radiation in decreasing local failures in patients who undergo lumpectomy. We discussed the process of simulation and the placement tattoos. We discussed 4-6 weeks of treatment as an outpatient. We discussed the possibility of asymptomatic lung damage. We discussed the low likelihood of secondary malignancies. We discussed the possible side effects including but not limited to skin redness, fatigue, permanent skin darkening, and breast swelling.  Plan: CT simulation will be scheduled next Wednesday, 08/03/15, at [facility] as to begin treatment soon. The patient signed a consent form and this was placed in her medical chart.  ____________________________________ -----------------------------------  Blair Promise, PhD, MD  This document serves as a record of services personally performed by Gery Pray, MD. It was created on his behalf by Darcus Austin, a trained medical scribe.  The creation of this record is based on the scribe's personal observations and the provider's statements to them. This document has been checked and approved by the attending provider.

## 2015-08-03 ENCOUNTER — Ambulatory Visit
Admission: RE | Admit: 2015-08-03 | Discharge: 2015-08-03 | Disposition: A | Discharge status: Home / Self Care | Payer: BLUE CROSS/BLUE SHIELD | Source: Ambulatory Visit | Attending: Radiation Oncology | Admitting: Radiation Oncology

## 2015-08-03 DIAGNOSIS — C50211 Malignant neoplasm of upper-inner quadrant of right female breast: Secondary | ICD-10-CM

## 2015-08-03 DIAGNOSIS — Z510 Encounter for antineoplastic radiation therapy: Secondary | ICD-10-CM | POA: Diagnosis not present

## 2015-08-03 NOTE — Progress Notes (Signed)
  Radiation Oncology         [phone] ________________________________  Name: Taylor Zamora MRN: [id number]  Date: 08/03/2015  DOB: [birth year]  SIMULATION AND TREATMENT PLANNING NOTE    ICD-9-CM ICD-10-CM   1. Breast cancer of upper-inner quadrant of right female breast ([place]) 174.2 C50.211     DIAGNOSIS: Stage IIA (pT2pN0) invasive lobular carcinoma of the right breast  NARRATIVE:  The patient was brought to the [facility].  Identity was confirmed.  All relevant records and images related to the planned course of therapy were reviewed.  The patient freely provided informed written consent to proceed with treatment after reviewing the details related to the planned course of therapy. The consent form was witnessed and verified by the simulation staff.  Then, the patient was set-up in a stable reproducible  supine position for radiation therapy.  CT images were obtained.  Surface markings were placed.  The CT images were loaded into the planning software.  Then the target and avoidance structures were contoured.  Treatment planning then occurred.  The radiation prescription was entered and confirmed.  Then, I designed and supervised the construction of a total of 3 medically necessary complex treatment devices.  I have requested : 3D Simulation  I have requested a DVH of the following structures: lumpectomy cavity, lungs, heart.  I have ordered:dose calc.  PLAN:  The patient will receive 50.4 Gy in 28 fractions Followed by a boost to the lumpectomy of 10 gray and a cumulative dose of 60.4 gray.  ________________________________ -----------------------------------  Blair Promise, PhD, MD  This document serves as a record of services personally performed by Gery Pray, MD. It was created on his behalf by Darcus Austin, a trained medical scribe. The creation of this record is based on the scribe's personal observations and the provider's statements to them. This  document has been checked and approved by the attending provider.

## 2015-08-09 NOTE — Progress Notes (Signed)
  Radiation Oncology         [phone] ________________________________  Name: Taylor Zamora MRN: [id number]  Date: 08/03/2015  DOB: 1965-12-06  Optical Surface Tracking Plan:  Since intensity modulated radiotherapy (IMRT) and 3D conformal radiation treatment methods are predicated on accurate and precise positioning for treatment, intrafraction motion monitoring is medically necessary to ensure accurate and safe treatment delivery.  The ability to quantify intrafraction motion without excessive ionizing radiation dose can only be performed with optical surface tracking. Accordingly, surface imaging offers the opportunity to obtain 3D measurements of patient position throughout IMRT and 3D treatments without excessive radiation exposure.  I am ordering optical surface tracking for this patient's upcoming course of radiotherapy. ________________________________  Gery Pray, MD 08/09/2015 6:50 PM    Reference:   Particia Jasper, et al. Surface imaging-based analysis of intrafraction motion for breast radiotherapy patients.Journal of [place], n. 6, nov. 2014. ISSN [id number].   Available at: <http://www.jacmp.org/index.php/jacmp/article/view/4957>.

## 2015-08-10 ENCOUNTER — Ambulatory Visit
Admission: RE | Admit: 2015-08-10 | Discharge: 2015-08-10 | Disposition: A | Discharge status: Home / Self Care | Payer: BLUE CROSS/BLUE SHIELD | Source: Ambulatory Visit | Attending: Radiation Oncology | Admitting: Radiation Oncology

## 2015-08-10 DIAGNOSIS — Z510 Encounter for antineoplastic radiation therapy: Secondary | ICD-10-CM | POA: Diagnosis not present

## 2015-08-10 DIAGNOSIS — C50211 Malignant neoplasm of upper-inner quadrant of right female breast: Secondary | ICD-10-CM

## 2015-08-10 NOTE — Progress Notes (Signed)
  Radiation Oncology         [phone] ________________________________  Name: Taylor Zamora MRN: [id number]  Date: 08/10/2015  DOB: 04/17/1965  Simulation Verification Note    ICD-9-CM ICD-10-CM   1. Breast cancer of upper-inner quadrant of right female breast ([place]) 174.2 C50.211     Status: outpatient  NARRATIVE: The patient was brought to the treatment unit and placed in the planned treatment position. The clinical setup was verified. Then port films were obtained and uploaded to the radiation oncology medical record software.  The treatment beams were carefully compared against the planned radiation fields. The position location and shape of the radiation fields was reviewed. They targeted volume of tissue appears to be appropriately covered by the radiation beams. Organs at risk appear to be excluded as planned.  Based on my personal review, I approved the simulation verification. The patient's treatment will proceed as planned.  -----------------------------------  Blair Promise, PhD, MD

## 2015-08-11 ENCOUNTER — Ambulatory Visit
Admission: RE | Admit: 2015-08-11 | Discharge: 2015-08-11 | Disposition: A | Discharge status: Home / Self Care | Payer: BLUE CROSS/BLUE SHIELD | Source: Ambulatory Visit | Attending: Radiation Oncology | Admitting: Radiation Oncology

## 2015-08-11 DIAGNOSIS — Z510 Encounter for antineoplastic radiation therapy: Secondary | ICD-10-CM | POA: Diagnosis not present

## 2015-08-12 ENCOUNTER — Ambulatory Visit
Admission: RE | Admit: 2015-08-12 | Discharge: 2015-08-12 | Disposition: A | Discharge status: Home / Self Care | Payer: BLUE CROSS/BLUE SHIELD | Source: Ambulatory Visit | Attending: Radiation Oncology | Admitting: Radiation Oncology

## 2015-08-12 DIAGNOSIS — Z510 Encounter for antineoplastic radiation therapy: Secondary | ICD-10-CM | POA: Diagnosis not present

## 2015-08-15 ENCOUNTER — Ambulatory Visit
Admission: RE | Admit: 2015-08-15 | Discharge: 2015-08-15 | Disposition: A | Discharge status: Home / Self Care | Payer: BLUE CROSS/BLUE SHIELD | Source: Ambulatory Visit | Attending: Radiation Oncology | Admitting: Radiation Oncology

## 2015-08-15 DIAGNOSIS — Z510 Encounter for antineoplastic radiation therapy: Secondary | ICD-10-CM | POA: Diagnosis not present

## 2015-08-16 ENCOUNTER — Ambulatory Visit
Admission: RE | Admit: 2015-08-16 | Discharge: 2015-08-16 | Disposition: A | Discharge status: Home / Self Care | Payer: BLUE CROSS/BLUE SHIELD | Source: Ambulatory Visit | Attending: Radiation Oncology | Admitting: Radiation Oncology

## 2015-08-16 ENCOUNTER — Encounter: Payer: Self-pay | Admitting: Radiation Oncology

## 2015-08-16 VITALS — BP 113/43 | HR 67 | Temp 98.6°F | Ht 59.0 in | Wt 133.6 lb

## 2015-08-16 DIAGNOSIS — Z510 Encounter for antineoplastic radiation therapy: Secondary | ICD-10-CM | POA: Diagnosis not present

## 2015-08-16 DIAGNOSIS — C50211 Malignant neoplasm of upper-inner quadrant of right female breast: Secondary | ICD-10-CM

## 2015-08-16 MED ORDER — ALRA NON-METALLIC DEODORANT (RAD-ONC)
1.0000 "application " | Freq: Once | TOPICAL | Status: AC
  Administered 2015-08-16: 1 via TOPICAL

## 2015-08-16 MED ORDER — RADIAPLEXRX EX GEL
Freq: Once | CUTANEOUS | Status: AC
  Administered 2015-08-16: 17:00:00 via TOPICAL

## 2015-08-16 NOTE — Progress Notes (Signed)
  Radiation Oncology         [phone]) [phone] ________________________________  Name: Taylor Zamora MRN: [id number]  Date: 08/16/2015  DOB: March 13, 1965  Weekly Radiation Therapy Management    ICD-9-CM ICD-10-CM   1. Breast cancer of upper-inner quadrant of right female breast ([facility]) 174.2 C50.211      Current Dose: 7.2 Gy     Planned Dose:  60.4 Gy  Narrative . . . . . . . . The patient presents for routine under treatment assessment.                                   Taylor Zamora has completed 4 fractions to her right breast. She denies having pain but does have discomfort in her right shoulder from a past rotater cuff injury. She reports having fatigue.                                 Set-up films were reviewed.                                 The chart was checked. Physical Findings. . .  height is 4\' 11"  (1.499 m) and weight is 133 lb 9.6 oz (60.601 kg). Her oral temperature is 98.6 F (37 C). Her blood pressure is 113/43 and her pulse is 67. . The lungs are clear. The heart has a regular rhythm and rate. Right breast area shows no appreciable radiation reaction thus far. Impression . . . . . . . The patient is tolerating radiation. Plan . . . . . . . . . . . . Continue treatment as planned.  ________________________________   Blair Promise, PhD, MD

## 2015-08-16 NOTE — Progress Notes (Signed)
Taylor Zamora has completed 4 fractions to her right breast.  She denies having pain but does have discomfort in her right shoulder from a past rotater cuff injury.  She reports having fatigue.  The skin on her right breast is intact.  BP 113/43 mmHg  Pulse 67  Temp(Src) 98.6 F (37 C) (Oral)  Ht 4\' 11"  (1.499 m)  Wt 133 lb 9.6 oz (60.601 kg)  BMI 26.97 kg/m2  LMP 07/07/2015

## 2015-08-16 NOTE — Progress Notes (Signed)
Pt here for patient teaching.  Pt given Radiation and You booklet, skin care instructions, Alra deodorant and Radiaplex gel. Pt reports they have not watched the Radiation Therapy Education video and has been given the link to watch at home. Reviewed areas of pertinence such as fatigue, skin changes, breast tenderness and breast swelling . Pt able to give teach back of to pat skin and use unscented/gentle soap,apply Radiaplex bid and avoid applying anything to skin within 4 hours of treatment. Pt demonstrated understanding and verbalizes understanding of information given and will contact nursing with any questions or concerns.          

## 2015-08-17 ENCOUNTER — Ambulatory Visit
Admission: RE | Admit: 2015-08-17 | Discharge: 2015-08-17 | Disposition: A | Discharge status: Home / Self Care | Payer: BLUE CROSS/BLUE SHIELD | Source: Ambulatory Visit | Attending: Radiation Oncology | Admitting: Radiation Oncology

## 2015-08-17 DIAGNOSIS — Z510 Encounter for antineoplastic radiation therapy: Secondary | ICD-10-CM | POA: Diagnosis not present

## 2015-08-18 ENCOUNTER — Ambulatory Visit
Admission: RE | Admit: 2015-08-18 | Discharge: 2015-08-18 | Disposition: A | Discharge status: Home / Self Care | Payer: BLUE CROSS/BLUE SHIELD | Source: Ambulatory Visit | Attending: Radiation Oncology | Admitting: Radiation Oncology

## 2015-08-18 DIAGNOSIS — Z510 Encounter for antineoplastic radiation therapy: Secondary | ICD-10-CM | POA: Diagnosis not present

## 2015-08-19 ENCOUNTER — Ambulatory Visit
Admission: RE | Admit: 2015-08-19 | Discharge: 2015-08-19 | Disposition: A | Discharge status: Home / Self Care | Payer: BLUE CROSS/BLUE SHIELD | Source: Ambulatory Visit | Attending: Radiation Oncology | Admitting: Radiation Oncology

## 2015-08-19 ENCOUNTER — Telehealth: Payer: Self-pay | Admitting: *Deleted

## 2015-08-19 DIAGNOSIS — Z510 Encounter for antineoplastic radiation therapy: Secondary | ICD-10-CM | POA: Diagnosis not present

## 2015-08-19 NOTE — Telephone Encounter (Signed)
Spoke with patient to follow up after start of radiation.  She states she is doing well.  Encouraged her to call with any needs or concerns. 

## 2015-08-22 ENCOUNTER — Ambulatory Visit
Admission: RE | Admit: 2015-08-22 | Discharge: 2015-08-22 | Disposition: A | Discharge status: Home / Self Care | Payer: BLUE CROSS/BLUE SHIELD | Source: Ambulatory Visit | Attending: Radiation Oncology | Admitting: Radiation Oncology

## 2015-08-22 DIAGNOSIS — Z510 Encounter for antineoplastic radiation therapy: Secondary | ICD-10-CM | POA: Diagnosis not present

## 2015-08-23 ENCOUNTER — Ambulatory Visit
Admission: RE | Admit: 2015-08-23 | Discharge: 2015-08-23 | Disposition: A | Discharge status: Home / Self Care | Payer: BLUE CROSS/BLUE SHIELD | Source: Ambulatory Visit | Attending: Radiation Oncology | Admitting: Radiation Oncology

## 2015-08-23 ENCOUNTER — Encounter: Payer: Self-pay | Admitting: Radiation Oncology

## 2015-08-23 VITALS — BP 127/59 | HR 68 | Temp 98.0°F | Ht 59.0 in | Wt 133.1 lb

## 2015-08-23 DIAGNOSIS — Z510 Encounter for antineoplastic radiation therapy: Secondary | ICD-10-CM | POA: Diagnosis not present

## 2015-08-23 DIAGNOSIS — C50211 Malignant neoplasm of upper-inner quadrant of right female breast: Secondary | ICD-10-CM

## 2015-08-23 NOTE — Progress Notes (Signed)
  Radiation Oncology         [phone]) [phone] ________________________________  Name: Taylor Zamora MRN: [id number]  Date: 08/23/2015  DOB: 11-14-1965  Weekly Radiation Therapy Management    ICD-9-CM ICD-10-CM   1. Breast cancer of upper-inner quadrant of right female breast ([facility]) 174.2 C50.211      Current Dose: 16.2 Gy     Planned Dose:  60.4 Gy  Narrative . . . . . . . . The patient presents for routine under treatment assessment.                                   Taylor Zamora has completed 9 fractions to her right breast. She denies pain but does report having some discomfort in her right breast. She reports having fatigue. She is using radiaplex gel. The skin on her right breast has slight hyperpigmentation.                                  Set-up films were reviewed.                                 The chart was checked. Physical Findings. . .  height is 4\' 11"  (1.499 m) and weight is 133 lb 1.6 oz (60.374 kg). Her oral temperature is 98 F (36.7 C). Her blood pressure is 127/59 and her pulse is 68. . The lungs are clear. The heart has a regular rhythm and rate. Right breast area shows mild hyperpigmentation changes.  Impression . . . . . . . The patient is tolerating radiation. Plan . . . . . . . . . . . . Continue treatment as planned. Continue using radiaplex.   ________________________________   Blair Promise, PhD, MD  This document serves as a record of services personally performed by Gery Pray, MD. It was created on his behalf by Derek Mound, a trained medical scribe. The creation of this record is based on the scribe's personal observations and the provider's statements to them. This document has been checked and approved by the attending provider.

## 2015-08-23 NOTE — Progress Notes (Signed)
Taylor Zamora has completed 9 fractions to her right breast.  She denies pain but does report having some discomfort in her right breast.  She reports having fatigue.  She is using radiaplex gel.  The skin on her right breast has slight hyperpigmentation.  BP 127/59 mmHg  Pulse 68  Temp(Src) 98 F (36.7 C) (Oral)  Ht 4\' 11"  (1.499 m)  Wt 133 lb 1.6 oz (60.374 kg)  BMI 26.87 kg/m2  LMP 07/07/2015

## 2015-08-24 ENCOUNTER — Ambulatory Visit
Admission: RE | Admit: 2015-08-24 | Discharge: 2015-08-24 | Disposition: A | Discharge status: Home / Self Care | Payer: BLUE CROSS/BLUE SHIELD | Source: Ambulatory Visit | Attending: Radiation Oncology | Admitting: Radiation Oncology

## 2015-08-24 DIAGNOSIS — Z510 Encounter for antineoplastic radiation therapy: Secondary | ICD-10-CM | POA: Diagnosis not present

## 2015-08-25 ENCOUNTER — Ambulatory Visit
Admission: RE | Admit: 2015-08-25 | Discharge: 2015-08-25 | Disposition: A | Discharge status: Home / Self Care | Payer: BLUE CROSS/BLUE SHIELD | Source: Ambulatory Visit | Attending: Radiation Oncology | Admitting: Radiation Oncology

## 2015-08-25 DIAGNOSIS — Z510 Encounter for antineoplastic radiation therapy: Secondary | ICD-10-CM | POA: Diagnosis not present

## 2015-08-26 ENCOUNTER — Ambulatory Visit
Admission: RE | Admit: 2015-08-26 | Discharge: 2015-08-26 | Disposition: A | Discharge status: Home / Self Care | Payer: BLUE CROSS/BLUE SHIELD | Source: Ambulatory Visit | Attending: Radiation Oncology | Admitting: Radiation Oncology

## 2015-08-26 DIAGNOSIS — Z510 Encounter for antineoplastic radiation therapy: Secondary | ICD-10-CM | POA: Diagnosis not present

## 2015-08-29 ENCOUNTER — Ambulatory Visit
Admission: RE | Admit: 2015-08-29 | Discharge: 2015-08-29 | Disposition: A | Discharge status: Home / Self Care | Payer: BLUE CROSS/BLUE SHIELD | Source: Ambulatory Visit | Attending: Radiation Oncology | Admitting: Radiation Oncology

## 2015-08-29 DIAGNOSIS — Z510 Encounter for antineoplastic radiation therapy: Secondary | ICD-10-CM | POA: Diagnosis not present

## 2015-08-30 ENCOUNTER — Encounter: Payer: Self-pay | Admitting: Radiation Oncology

## 2015-08-30 ENCOUNTER — Encounter: Payer: Self-pay | Admitting: Oncology

## 2015-08-30 ENCOUNTER — Ambulatory Visit
Admission: RE | Admit: 2015-08-30 | Discharge: 2015-08-30 | Disposition: A | Discharge status: Home / Self Care | Payer: BLUE CROSS/BLUE SHIELD | Source: Ambulatory Visit | Attending: Radiation Oncology | Admitting: Radiation Oncology

## 2015-08-30 VITALS — BP 116/65 | HR 77 | Temp 98.0°F | Ht 59.0 in | Wt 133.8 lb

## 2015-08-30 DIAGNOSIS — Z510 Encounter for antineoplastic radiation therapy: Secondary | ICD-10-CM | POA: Diagnosis not present

## 2015-08-30 DIAGNOSIS — C50211 Malignant neoplasm of upper-inner quadrant of right female breast: Secondary | ICD-10-CM

## 2015-08-30 NOTE — Progress Notes (Signed)
  Radiation Oncology         [phone] ________________________________  Name: Taylor Zamora MRN: [id number]  Date: 08/30/2015  DOB: 14-Oct-1965  Weekly Radiation Therapy Management    ICD-9-CM ICD-10-CM   1. Breast cancer of upper-inner quadrant of right female breast ([facility]) 174.2 C50.211      Current Dose: 25.2 Gy     Planned Dose:  60.4 Gy  Narrative . . . . . . . . The patient presents for routine under treatment assessment.   Taylor Zamora has completed 14 fractions to her right breast. She denies having pain. She does report having fatigue. She is using radiaplex gel.                                 Set-up films were reviewed.                                 The chart was checked. Physical Findings. . .  height is 4\' 11"  (1.499 m) and weight is 133 lb 12.8 oz (60.691 kg). Her oral temperature is 98 F (36.7 C). Her blood pressure is 116/65 and her pulse is 77. Her oxygen saturation is 100%. . The lungs are clear. The heart has a regular rhythm and rate. Mild hyperpigmentation changes in the right breast. Impression . . . . . . . The patient is tolerating radiation. Plan . . . . . . . . . . . . Continue treatment as planned. The patient will be on a vacation from this weekend until approximately next Thursday or Friday for July 4th. I advised her to notify the radiation therapists. I also advised her to practice good sun hygiene. ________________________________   Blair Promise, PhD, MD  This document serves as a record of services personally performed by Gery Pray, MD. It was created on his behalf by Darcus Austin, a trained medical scribe. The creation of this record is based on the scribe's personal observations and the provider's statements to them. This document has been checked and approved by the attending provider.

## 2015-08-30 NOTE — Progress Notes (Signed)
Taylor Zamora has completed 14 fractions to her right breast.  She denies having pain.  She does report having fatigue.  She is using radiaplex gel.  The skin on her right breast has slight hyperpigmentation.  BP 116/65 mmHg  Pulse 77  Temp(Src) 98 F (36.7 C) (Oral)  Ht 4\' 11"  (1.499 m)  Wt 133 lb 12.8 oz (60.691 kg)  BMI 27.01 kg/m2  SpO2 100%   Wt Readings from Last 3 Encounters:  08/30/15 133 lb 12.8 oz (60.691 kg)  08/23/15 133 lb 1.6 oz (60.374 kg)  08/16/15 133 lb 9.6 oz (60.601 kg)

## 2015-08-31 ENCOUNTER — Ambulatory Visit
Admission: RE | Admit: 2015-08-31 | Discharge: 2015-08-31 | Disposition: A | Discharge status: Home / Self Care | Payer: BLUE CROSS/BLUE SHIELD | Source: Ambulatory Visit | Attending: Radiation Oncology | Admitting: Radiation Oncology

## 2015-08-31 DIAGNOSIS — Z510 Encounter for antineoplastic radiation therapy: Secondary | ICD-10-CM | POA: Diagnosis not present

## 2015-09-01 ENCOUNTER — Ambulatory Visit
Admission: RE | Admit: 2015-09-01 | Discharge: 2015-09-01 | Disposition: A | Discharge status: Home / Self Care | Payer: BLUE CROSS/BLUE SHIELD | Source: Ambulatory Visit | Attending: Radiation Oncology | Admitting: Radiation Oncology

## 2015-09-01 DIAGNOSIS — Z510 Encounter for antineoplastic radiation therapy: Secondary | ICD-10-CM | POA: Diagnosis not present

## 2015-09-02 ENCOUNTER — Ambulatory Visit
Admission: RE | Admit: 2015-09-02 | Discharge: 2015-09-02 | Disposition: A | Discharge status: Home / Self Care | Payer: BLUE CROSS/BLUE SHIELD | Source: Ambulatory Visit | Attending: Radiation Oncology | Admitting: Radiation Oncology

## 2015-09-02 DIAGNOSIS — Z510 Encounter for antineoplastic radiation therapy: Secondary | ICD-10-CM | POA: Diagnosis not present

## 2015-09-05 ENCOUNTER — Ambulatory Visit: Admission: RE | Admit: 2015-09-05 | Payer: BLUE CROSS/BLUE SHIELD | Source: Ambulatory Visit

## 2015-09-05 ENCOUNTER — Ambulatory Visit: Payer: BLUE CROSS/BLUE SHIELD

## 2015-09-07 ENCOUNTER — Ambulatory Visit: Payer: BLUE CROSS/BLUE SHIELD

## 2015-09-07 ENCOUNTER — Ambulatory Visit: Admission: RE | Admit: 2015-09-07 | Payer: BLUE CROSS/BLUE SHIELD | Source: Ambulatory Visit

## 2015-09-08 ENCOUNTER — Ambulatory Visit: Payer: BLUE CROSS/BLUE SHIELD

## 2015-09-08 ENCOUNTER — Ambulatory Visit
Admission: RE | Admit: 2015-09-08 | Discharge: 2015-09-08 | Disposition: A | Discharge status: Home / Self Care | Payer: BLUE CROSS/BLUE SHIELD | Source: Ambulatory Visit | Attending: Radiation Oncology | Admitting: Radiation Oncology

## 2015-09-09 ENCOUNTER — Ambulatory Visit
Admission: RE | Admit: 2015-09-09 | Discharge: 2015-09-09 | Disposition: A | Discharge status: Home / Self Care | Payer: BLUE CROSS/BLUE SHIELD | Source: Ambulatory Visit | Attending: Radiation Oncology | Admitting: Radiation Oncology

## 2015-09-09 ENCOUNTER — Encounter: Payer: Self-pay | Admitting: Radiation Oncology

## 2015-09-09 VITALS — BP 124/75 | HR 69 | Temp 98.0°F | Ht 59.0 in | Wt 136.1 lb

## 2015-09-09 DIAGNOSIS — Z510 Encounter for antineoplastic radiation therapy: Secondary | ICD-10-CM | POA: Diagnosis not present

## 2015-09-09 DIAGNOSIS — C50211 Malignant neoplasm of upper-inner quadrant of right female breast: Secondary | ICD-10-CM

## 2015-09-09 MED ORDER — SONAFINE EX EMUL
1.0000 "application " | Freq: Once | CUTANEOUS | Status: AC
  Administered 2015-09-09: 1 via TOPICAL

## 2015-09-09 NOTE — Progress Notes (Signed)
Taylor Zamora has completed 18 fractions to her right breast.  She denies having pain.  She reports having itching around her nipple area.  She is using using radiaplex and will be given sonafine to try.  She reports feeling fatigued.  The skin on her right breast has hyperpigmentation.  BP 124/75 mmHg  Pulse 69  Temp(Src) 98 F (36.7 C) (Oral)  Ht 4\' 11"  (1.499 m)  Wt 136 lb 1.6 oz (61.735 kg)  BMI 27.47 kg/m2  SpO2 100%   Wt Readings from Last 3 Encounters:  09/09/15 136 lb 1.6 oz (61.735 kg)  08/30/15 133 lb 12.8 oz (60.691 kg)  08/23/15 133 lb 1.6 oz (60.374 kg)

## 2015-09-09 NOTE — Progress Notes (Signed)
  Radiation Oncology         [phone] ________________________________  Name: Taylor Zamora MRN: [id number]  Date: 09/09/2015  DOB: 07/31/1965  Weekly Radiation Therapy Management    ICD-9-CM ICD-10-CM   1. Breast cancer of upper-inner quadrant of right female breast ([facility]) 174.2 C50.211 SONAFINE emulsion 1 application     Current Dose: 32.4 Gy     Planned Dose:  60.4 Gy  Narrative  The patient presents for routine under treatment assessment. Set-up films were reviewed. The chart was checked. Taylor Zamora has completed 18 fractions to her right breast. She denies having pain. She reports having itching around her nipple area. She is using using radiaplex and will be given sonafine to try. She reports feeling fatigued. The skin on her right breast has hyperpigmentation.    Physical Findings. . .  height is 4\' 11"  (1.499 m) and weight is 136 lb 1.6 oz (61.735 kg). Her oral temperature is 98 F (36.7 C). Her blood pressure is 124/75 and her pulse is 69. Her oxygen saturation is 100%. .  Right breast is hyperpigmented but skin is intact, there is a mole or a hair follicle over the central right breast that has a dry white surface but this is not a clinical concern.   Impression . . . . . . . The patient is tolerating radiation. Plan . . . . . . . . . . . . Continue treatment as planned. Hydrocortisone 1% cream PRN itching, continue Radiaplex and or Sonafine topicals. -----------------------------------  Eppie Gibson, MD   This document serves as a record of services personally performed by Eppie Gibson , MD. It was created on his behalf by Truddie Hidden, a trained medical scribe. The creation of this record is based on the scribe's personal observations and the provider's statements to them. This document has been checked and approved by the attending provider.

## 2015-09-12 ENCOUNTER — Ambulatory Visit
Admission: RE | Admit: 2015-09-12 | Discharge: 2015-09-12 | Disposition: A | Discharge status: Home / Self Care | Payer: BLUE CROSS/BLUE SHIELD | Source: Ambulatory Visit | Attending: Radiation Oncology | Admitting: Radiation Oncology

## 2015-09-12 DIAGNOSIS — Z510 Encounter for antineoplastic radiation therapy: Secondary | ICD-10-CM | POA: Diagnosis not present

## 2015-09-13 ENCOUNTER — Ambulatory Visit: Payer: BLUE CROSS/BLUE SHIELD | Admitting: Radiation Oncology

## 2015-09-13 ENCOUNTER — Ambulatory Visit
Admission: RE | Admit: 2015-09-13 | Discharge: 2015-09-13 | Disposition: A | Discharge status: Home / Self Care | Payer: BLUE CROSS/BLUE SHIELD | Source: Ambulatory Visit | Attending: Radiation Oncology | Admitting: Radiation Oncology

## 2015-09-13 VITALS — BP 106/62 | HR 60 | Temp 98.3°F | Ht 59.0 in | Wt 135.0 lb

## 2015-09-13 DIAGNOSIS — Z510 Encounter for antineoplastic radiation therapy: Secondary | ICD-10-CM | POA: Diagnosis not present

## 2015-09-13 DIAGNOSIS — C50211 Malignant neoplasm of upper-inner quadrant of right female breast: Secondary | ICD-10-CM

## 2015-09-13 NOTE — Progress Notes (Signed)
Taylor Zamora has completed 20 fractions to her right breast.  She denies having pain.  She does having itching outside her right nipple area and has been using sonafine which has been helping.  She denies having fatigue.  The skin on her right breast has hyperpigmentation.  BP 106/62 mmHg  Pulse 60  Temp(Src) 98.3 F (36.8 C) (Oral)  Ht 4\' 11"  (1.499 m)  Wt 135 lb (61.236 kg)  BMI 27.25 kg/m2  SpO2 100%   Wt Readings from Last 3 Encounters:  09/13/15 135 lb (61.236 kg)  09/09/15 136 lb 1.6 oz (61.735 kg)  08/30/15 133 lb 12.8 oz (60.691 kg)

## 2015-09-13 NOTE — Progress Notes (Signed)
  Radiation Oncology         [phone] ________________________________  Name: Taylor Zamora MRN: [id number]  Date: 09/13/2015  DOB: 16-Jun-1965    Weekly Radiation Therapy Management    ICD-9-CM ICD-10-CM   1. Breast cancer of upper-inner quadrant of right female breast ([facility]) 174.2 C50.211      Current Dose: 37.8 Gy     Planned Dose:  60.4 Gy  Narrative . . . . . . . . The patient presents for routine under treatment assessment.                                   Taylor Zamora has completed 20 fractions to her right breast. She denies having pain. She does having itching outside her right nipple area and has been using sonafine which has been helping. She denies having fatigue.                                  Set-up films were reviewed.                                 The chart was checked. Physical Findings. . .  height is 4\' 11"  (1.499 m) and weight is 135 lb (61.236 kg). Her oral temperature is 98.3 F (36.8 C). Her blood pressure is 106/62 and her pulse is 60. Her oxygen saturation is 100%. . Weight essentially stable.  No significant changes. Lungs are clear to auscultation bilaterally. Heart has regular rate and rhythm. No palpable cervical, supraclavicular, or axillary adenopathy. Abdomen soft, non-tender, normal bowel sounds. Mild hyperpigmentation changes in the right breast. Impression . . . . . . . The patient is tolerating radiation. Plan . . . . . . . . . . . . Continue treatment as planned.  ________________________________   Blair Promise, PhD, MD   This document serves as a record of services personally performed by Gery Pray, MD. It was created on his behalf by Arlyce Harman, a trained medical scribe. The creation of this record is based on the scribe's personal observations and the provider's statements to them. This document has been checked and approved by the attending provider.

## 2015-09-14 ENCOUNTER — Ambulatory Visit
Admission: RE | Admit: 2015-09-14 | Discharge: 2015-09-14 | Disposition: A | Discharge status: Home / Self Care | Payer: BLUE CROSS/BLUE SHIELD | Source: Ambulatory Visit | Attending: Radiation Oncology | Admitting: Radiation Oncology

## 2015-09-14 DIAGNOSIS — Z510 Encounter for antineoplastic radiation therapy: Secondary | ICD-10-CM | POA: Diagnosis not present

## 2015-09-15 ENCOUNTER — Ambulatory Visit
Admission: RE | Admit: 2015-09-15 | Discharge: 2015-09-15 | Disposition: A | Discharge status: Home / Self Care | Payer: BLUE CROSS/BLUE SHIELD | Source: Ambulatory Visit | Attending: Radiation Oncology | Admitting: Radiation Oncology

## 2015-09-15 DIAGNOSIS — Z510 Encounter for antineoplastic radiation therapy: Secondary | ICD-10-CM | POA: Diagnosis not present

## 2015-09-16 ENCOUNTER — Ambulatory Visit
Admission: RE | Admit: 2015-09-16 | Discharge: 2015-09-16 | Disposition: A | Discharge status: Home / Self Care | Payer: BLUE CROSS/BLUE SHIELD | Source: Ambulatory Visit | Attending: Radiation Oncology | Admitting: Radiation Oncology

## 2015-09-16 DIAGNOSIS — Z510 Encounter for antineoplastic radiation therapy: Secondary | ICD-10-CM | POA: Diagnosis not present

## 2015-09-19 ENCOUNTER — Ambulatory Visit
Admission: RE | Admit: 2015-09-19 | Discharge: 2015-09-19 | Disposition: A | Discharge status: Home / Self Care | Payer: BLUE CROSS/BLUE SHIELD | Source: Ambulatory Visit | Attending: Radiation Oncology | Admitting: Radiation Oncology

## 2015-09-19 DIAGNOSIS — Z510 Encounter for antineoplastic radiation therapy: Secondary | ICD-10-CM | POA: Diagnosis not present

## 2015-09-20 ENCOUNTER — Ambulatory Visit
Admission: RE | Admit: 2015-09-20 | Discharge: 2015-09-20 | Disposition: A | Discharge status: Home / Self Care | Payer: BLUE CROSS/BLUE SHIELD | Source: Ambulatory Visit | Attending: Radiation Oncology | Admitting: Radiation Oncology

## 2015-09-20 ENCOUNTER — Encounter: Payer: Self-pay | Admitting: Radiation Oncology

## 2015-09-20 VITALS — BP 119/74 | HR 57 | Temp 97.8°F | Ht 59.0 in | Wt 136.6 lb

## 2015-09-20 DIAGNOSIS — Z510 Encounter for antineoplastic radiation therapy: Secondary | ICD-10-CM | POA: Diagnosis not present

## 2015-09-20 DIAGNOSIS — C50211 Malignant neoplasm of upper-inner quadrant of right female breast: Secondary | ICD-10-CM

## 2015-09-20 NOTE — Progress Notes (Signed)
Taylor Zamora is here for her 26th fraction of radiation to her Right Breast. She reports some fatigue. Her Right Breast is hyperpigmented and she reports it itches around her Right nipple. She is using the sonafine cream to these areas with some relief of her itching, but she states it does not work very long. She plans to try hydrocortisone cream soon.   BP 119/74 mmHg  Pulse 57  Temp(Src) 97.8 F (36.6 C)  Ht 4\' 11"  (1.499 m)  Wt 136 lb 9.6 oz (61.961 kg)  BMI 27.57 kg/m2   Wt Readings from Last 3 Encounters:  09/20/15 136 lb 9.6 oz (61.961 kg)  09/13/15 135 lb (61.236 kg)  09/09/15 136 lb 1.6 oz (61.735 kg)

## 2015-09-20 NOTE — Progress Notes (Signed)
  Radiation Oncology         [phone] ________________________________  Name: Taylor Zamora MRN: [id number]  Date: 09/20/2015  DOB: [birth year]  Electron beam Simulation Verification Note    ICD-9-CM ICD-10-CM   1. Breast cancer of upper-inner quadrant of right female breast ([place]) 174.2 C50.211     Status: outpatient  NARRATIVE: The patient was brought to the treatment unit and placed in the planned treatment position. The clinical setup was verified. Then port films were obtained and uploaded to the radiation oncology medical record software.  The treatment beams were carefully compared against the planned radiation fields. The position location and shape of the radiation fields was reviewed. They targeted volume of tissue appears to be appropriately covered by the radiation beams. Organs at risk appear to be excluded as planned.  Based on my personal review, I approved the simulation verification. The patient's treatment will proceed as planned.  -----------------------------------  Blair Promise, PhD, MD

## 2015-09-20 NOTE — Progress Notes (Signed)
  Radiation Oncology         [phone] ________________________________  Name: Taylor Zamora MRN: [id number]  Date: 09/20/2015  DOB: 10/10/1965    Weekly Radiation Therapy Management    ICD-9-CM  ICD-10-CM    1.  Breast cancer of upper-inner quadrant of right female breast ([facility])  174.2  C50.211      Current Dose: 46.8 Gy     Planned Dose:  60.4 Gy  Narrative . . . . . . . . The patient presents for routine under treatment assessment.                                   Taylor Zamora is here for her 26th fraction of radiation to her Right Breast. She reports some fatigue. Her Right Breast is hyperpigmented and she reports it itches around her Right nipple. She is using the sonafine cream to these areas with some relief of her itching, but she states it does not work very long. She plans to try hydrocortisone cream soon.                                 Set-up films were reviewed.                                 The chart was checked. Physical Findings. . .  height is 4\' 11"  (1.499 m) and weight is 136 lb 9.6 oz (61.961 kg). Her temperature is 97.8 F (36.6 C). Her blood pressure is 119/74 and her pulse is 57. . Weight essentially stable.  No significant changes. The lungs are clear to auscultation. The heart has a regular rhythm and rate. Hyperpigmentation changes. No skin breakdown. Impression . . . . . . . The patient is tolerating radiation. Plan . . . . . . . . . . . . Continue treatment as planned. She is going to start using hydrocortisone cream for the itching.   ________________________________   Blair Promise, PhD, MD    This document serves as a record of services personally performed by Gery Pray, MD. It was created on his behalf by Lendon Collar, a trained medical scribe. The creation of this record is based on the scribe's personal observations and the provider's statements to them. This document has been checked and approved by the attending provider.

## 2015-09-21 ENCOUNTER — Ambulatory Visit: Payer: BLUE CROSS/BLUE SHIELD

## 2015-09-21 ENCOUNTER — Ambulatory Visit
Admission: RE | Admit: 2015-09-21 | Discharge: 2015-09-21 | Disposition: A | Discharge status: Home / Self Care | Payer: BLUE CROSS/BLUE SHIELD | Source: Ambulatory Visit | Attending: Radiation Oncology | Admitting: Radiation Oncology

## 2015-09-21 DIAGNOSIS — Z510 Encounter for antineoplastic radiation therapy: Secondary | ICD-10-CM | POA: Diagnosis not present

## 2015-09-22 ENCOUNTER — Ambulatory Visit
Admission: RE | Admit: 2015-09-22 | Discharge: 2015-09-22 | Disposition: A | Discharge status: Home / Self Care | Payer: BLUE CROSS/BLUE SHIELD | Source: Ambulatory Visit | Attending: Radiation Oncology | Admitting: Radiation Oncology

## 2015-09-22 ENCOUNTER — Ambulatory Visit: Payer: BLUE CROSS/BLUE SHIELD

## 2015-09-22 DIAGNOSIS — Z510 Encounter for antineoplastic radiation therapy: Secondary | ICD-10-CM | POA: Diagnosis not present

## 2015-09-23 ENCOUNTER — Ambulatory Visit: Payer: BLUE CROSS/BLUE SHIELD

## 2015-09-23 ENCOUNTER — Ambulatory Visit
Admission: RE | Admit: 2015-09-23 | Discharge: 2015-09-23 | Disposition: A | Discharge status: Home / Self Care | Payer: BLUE CROSS/BLUE SHIELD | Source: Ambulatory Visit | Attending: Radiation Oncology | Admitting: Radiation Oncology

## 2015-09-23 DIAGNOSIS — Z510 Encounter for antineoplastic radiation therapy: Secondary | ICD-10-CM | POA: Diagnosis not present

## 2015-09-26 ENCOUNTER — Ambulatory Visit: Payer: BLUE CROSS/BLUE SHIELD

## 2015-09-26 ENCOUNTER — Ambulatory Visit
Admission: RE | Admit: 2015-09-26 | Discharge: 2015-09-26 | Disposition: A | Discharge status: Home / Self Care | Payer: BLUE CROSS/BLUE SHIELD | Source: Ambulatory Visit | Attending: Radiation Oncology | Admitting: Radiation Oncology

## 2015-09-26 DIAGNOSIS — Z510 Encounter for antineoplastic radiation therapy: Secondary | ICD-10-CM | POA: Diagnosis not present

## 2015-09-27 ENCOUNTER — Ambulatory Visit
Admission: RE | Admit: 2015-09-27 | Discharge: 2015-09-27 | Disposition: A | Discharge status: Home / Self Care | Payer: BLUE CROSS/BLUE SHIELD | Source: Ambulatory Visit | Attending: Radiation Oncology | Admitting: Radiation Oncology

## 2015-09-27 ENCOUNTER — Ambulatory Visit: Payer: BLUE CROSS/BLUE SHIELD

## 2015-09-27 ENCOUNTER — Encounter: Payer: Self-pay | Admitting: Radiation Oncology

## 2015-09-27 ENCOUNTER — Ambulatory Visit: Payer: BLUE CROSS/BLUE SHIELD | Admitting: Oncology

## 2015-09-27 VITALS — BP 121/82 | HR 65 | Temp 97.9°F | Ht 59.0 in | Wt 136.1 lb

## 2015-09-27 DIAGNOSIS — C50211 Malignant neoplasm of upper-inner quadrant of right female breast: Secondary | ICD-10-CM

## 2015-09-27 DIAGNOSIS — Z510 Encounter for antineoplastic radiation therapy: Secondary | ICD-10-CM | POA: Diagnosis not present

## 2015-09-27 NOTE — Progress Notes (Signed)
  Radiation Oncology         [phone] ________________________________  Name: Taylor Zamora MRN: [id number]  Date: 09/27/2015  DOB: [birth year]    Weekly Radiation Therapy Management    ICD-9-CM  ICD-10-CM    1.  Breast cancer of upper-inner quadrant of right female breast ([facility])  174.2  C50.211      Current Dose: 56.4 Gy     Planned Dose:  60.4 Gy  Narrative . . . . . . . . The patient presents for routine under treatment assessment.                            Taylor Zamora has completed 31 fractions to her right breast.  She denies having pain, but reports fatigue. She is using sonafine as directed.  She continues to have some itching of the right breast. The nurse notes the skin on her right breast has hyperpigmentation and some redness.                                 Set-up films were reviewed.                                 The chart was checked. Physical Findings. . .  height is 4\' 11"  (1.499 m) and weight is 136 lb 1.6 oz (61.7 kg). Her oral temperature is 97.9 F (36.6 C). Her blood pressure is 121/82 and her pulse is 65. Her oxygen saturation is 100%. . Weight essentially stable.  No significant changes. The lungs are clear to auscultation. The heart has a regular rhythm and rate. Hyperpigmentation changes of the right breast and mild dry desquamation. No moist desquamation. Impression . . . . . . . The patient is tolerating radiation. Plan . . . . . . . . . . . . Continue treatment as planned. The patient will miss her treatment tomorrow and Thursday since she will be out of town. We will do BID treatments on Friday so she could complete her treatment before the weekend. She will follow up in the clinica a month afterwards. ________________________________   Blair Promise, PhD, MD  This document serves as a record of services personally performed by Gery Pray, MD. It was created on his behalf by Darcus Austin, a trained medical scribe. The creation of this  record is based on the scribe's personal observations and the provider's statements to them. This document has been checked and approved by the attending provider.

## 2015-09-27 NOTE — Progress Notes (Addendum)
Taylor Zamora has completed 31 fractions to her right breast.  She denies having pain.  She reports having fatigue.  She is using sonafine as directed.  She continues to have some itching.  The skin on her right breast has hyperpigmentation and some redness.  BP 121/82 (BP Location: Left Arm, Patient Position: Sitting)   Pulse 65   Temp 97.9 F (36.6 C) (Oral)   Ht 4\' 11"  (1.499 m)   Wt 136 lb 1.6 oz (61.7 kg)   SpO2 100%   BMI 27.49 kg/m    Wt Readings from Last 3 Encounters:  09/27/15 136 lb 1.6 oz (61.7 kg)  09/20/15 136 lb 9.6 oz (62 kg)  09/13/15 135 lb (61.2 kg)

## 2015-09-28 ENCOUNTER — Ambulatory Visit: Payer: BLUE CROSS/BLUE SHIELD

## 2015-09-29 ENCOUNTER — Ambulatory Visit: Payer: BLUE CROSS/BLUE SHIELD

## 2015-09-30 ENCOUNTER — Other Ambulatory Visit: Payer: Self-pay | Admitting: *Deleted

## 2015-09-30 ENCOUNTER — Ambulatory Visit: Payer: BLUE CROSS/BLUE SHIELD

## 2015-09-30 ENCOUNTER — Telehealth: Payer: Self-pay | Admitting: *Deleted

## 2015-09-30 ENCOUNTER — Telehealth: Payer: Self-pay | Admitting: Oncology

## 2015-09-30 ENCOUNTER — Encounter: Payer: Self-pay | Admitting: Radiation Oncology

## 2015-09-30 ENCOUNTER — Ambulatory Visit
Admission: RE | Admit: 2015-09-30 | Discharge: 2015-09-30 | Disposition: A | Discharge status: Home / Self Care | Payer: BLUE CROSS/BLUE SHIELD | Source: Ambulatory Visit | Attending: Radiation Oncology | Admitting: Radiation Oncology

## 2015-09-30 DIAGNOSIS — Z510 Encounter for antineoplastic radiation therapy: Secondary | ICD-10-CM | POA: Diagnosis not present

## 2015-09-30 DIAGNOSIS — C50211 Malignant neoplasm of upper-inner quadrant of right female breast: Secondary | ICD-10-CM

## 2015-09-30 NOTE — Telephone Encounter (Signed)
Spoke with pt to confirm 9/22 appt date/time per pof

## 2015-09-30 NOTE — Telephone Encounter (Signed)
  Oncology Nurse Navigator Documentation  Navigator Location: [facility] (09/30/15 1300) Navigator Encounter Type: Telephone (09/30/15 1300) Telephone: [facility] Call (09/30/15 1300)         Patient Visit Type: [id number] (09/30/15 1300) Treatment Phase: Final Radiation Tx (09/30/15 1300)                            Time Spent with Patient: 15 (09/30/15 1300)

## 2015-10-01 ENCOUNTER — Ambulatory Visit: Payer: BLUE CROSS/BLUE SHIELD

## 2015-10-27 NOTE — Progress Notes (Addendum)
  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   2) Right Breast Boost: 10 Gy in 5 fractions.  Beams/energy: 1) 3D // 6X Photon   2) En Face // 9 MeV Electron  Narrative: The patient tolerated radiation treatment relatively well. The patient missed a few days of treatment for a July 4th vacation. Due to a planned event out of town, the patient had BID treatments on Friday (09/30/15) so she could complete her treatment before the weekend. She missed the treatments on 7/26 and 7/27 for that event. Her major complaint was pruritus of the right breast.  Plan: The patient has completed radiation treatment. The patient will return to radiation oncology clinic for routine followup in one month. I advised them to call or return sooner if they have any questions or concerns related to their recovery or treatment.  -----------------------------------  Blair Promise, PhD, MD  This document serves as a record of services personally performed by Gery Pray, MD. It was created on his behalf by Darcus Austin, a trained medical scribe. The creation of this record is based on the scribe's personal observations and the provider's statements to them. This document has been checked and approved by the attending provider.

## 2015-11-08 ENCOUNTER — Telehealth: Payer: Self-pay | Admitting: Nurse Practitioner

## 2015-11-08 NOTE — Telephone Encounter (Signed)
Patient calling to inquire about post-radiation f/u with Dr. Jana Hakim. She has an apt with Survivorship NP Mike Craze on 11/25/15, which she should keep this apt. Per Dr. Jana Hakim, to be scheduled for f/u with him, preferably that same day. Patient informed of this and that she should expect a call from scheduling dept to make this apt. She verbalizes understanding.   Per Dr. Jana Hakim, to schedule pt at 12 on 9/22; message sent to scheduling dept.

## 2015-11-23 ENCOUNTER — Encounter: Payer: Self-pay | Admitting: Adult Health

## 2015-11-25 ENCOUNTER — Ambulatory Visit (HOSPITAL_BASED_OUTPATIENT_CLINIC_OR_DEPARTMENT_OTHER): Payer: Self-pay | Admitting: Adult Health

## 2015-11-25 ENCOUNTER — Ambulatory Visit (HOSPITAL_BASED_OUTPATIENT_CLINIC_OR_DEPARTMENT_OTHER): Payer: BLUE CROSS/BLUE SHIELD | Admitting: Oncology

## 2015-11-25 VITALS — BP 126/77 | HR 65 | Temp 98.3°F | Resp 18 | Wt 133.3 lb

## 2015-11-25 DIAGNOSIS — F419 Anxiety disorder, unspecified: Secondary | ICD-10-CM

## 2015-11-25 DIAGNOSIS — Z170 Estrogen receptor positive status [ER+]: Secondary | ICD-10-CM

## 2015-11-25 DIAGNOSIS — C50211 Malignant neoplasm of upper-inner quadrant of right female breast: Secondary | ICD-10-CM | POA: Diagnosis not present

## 2015-11-25 DIAGNOSIS — N951 Menopausal and female climacteric states: Secondary | ICD-10-CM

## 2015-11-25 DIAGNOSIS — F5105 Insomnia due to other mental disorder: Principal | ICD-10-CM

## 2015-11-25 DIAGNOSIS — G4700 Insomnia, unspecified: Secondary | ICD-10-CM

## 2015-11-25 MED ORDER — TEMAZEPAM 15 MG PO CAPS: 15.0000 mg | ORAL_CAPSULE | Freq: Every evening | ORAL | 0 refills | Status: DC | PRN

## 2015-11-25 NOTE — Progress Notes (Signed)
[facility]  [phone] Fax:([phone]) [phone]     ID: Taylor Zamora DOB: May 22, 1965  MR#: [id number]  OZD#:[id number]  Patient Care Team: Taylor Blackbird, MD as PCP - General (Family Medicine) Taylor Overall, MD as Consulting Physician (General Surgery) Taylor Cruel, MD as Consulting Physician (Oncology) Taylor Cheese, NP as Nurse Practitioner (Hematology and Oncology) Taylor Farrier, MD as Consulting Physician (Obstetrics and Gynecology) Taylor Pray, MD as Consulting Physician (Radiation Oncology) PCP: Taylor Blackbird, MD OTHER MD:  CHIEF COMPLAINT: Estrogen receptor positive breast cancer  CURRENT TREATMENT: Tamoxifen   BREAST CANCER HISTORY: From the original intake note:  "Taylor Zamora" had bilateral screening mammography at the [facility] 02/16/2015. This suggested a possible asymmetry in the right breast. On 02/23/2015 the patient underwent right diagnostic mammography with tomosynthesis and right breast ultrasonography. The breast density was category C. In the upper right breast there was an area of distortion which was palpable at approximately 12:30 o'clock. Ultrasound found an ill-defined mass measuring 8 mm. Ultrasound of the right axilla was benign.  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  Her subsequent history is as detailed below  INTERVAL HISTORY: Taylor Zamora returns today for follow-up of her estrogen receptor positive breast cancer. Since her earlier visit here she had her right lumpectomy and sentinel lymph node sampling, on 06/27/2015. The pathology from this procedure ([id number]) showed an invasive lobular breast cancer measuring 2.1 cm, grade  1, with negative margins. The single sentinel lymph node was clear. Repeat HER-2 was again negative, with a signals ratio 1.17, the number per cell 2.05.  An Oncotype DX was obtained from this tumor and it showed a score of 10, predicting an outside the breast risk of recurrence of 7% within 10 years if the patient's only systemic treatment is tamoxifen for 5 years. It also predicts no benefit from chemotherapy.  Accordingly Taylor Zamora proceeded to radiation, which she completed in July. She generally did well with this, with some fatigue. The erythema she experiences largely resolved.  She is here now to discuss anti-estrogen treatment   REVIEW OF SYSTEMS: She did well with her surgery, with no significant pain, bleeding, or fever. As mentioned above she also tolerated her radiation well. She thinks she has "90%" recovered. She is in the perimenopausal state, with very irregular periods which are becoming more widely space and more scant. She is also already experiencing several menopausal symptoms including insomnia, increased weight, irritable mood, and hot flashes. A detailed review of systems today was otherwise stable.  PAST MEDICAL HISTORY: Past Medical History:  Diagnosis Date  . Allergic rhinitis    hay fever  . Breast cancer ([place])   . Breast cancer of upper-inner quadrant of right female breast ([place]) 05/03/2015  . Food allergy    carrots, celery, kiwi, apple, peanuts  . GERD (gastroesophageal reflux disease)   . Headache(784.0)   . Hyperlipidemia     PAST SURGICAL HISTORY: Past Surgical History:  Procedure Laterality Date  . CESAREAN SECTION  2000,2004   x 2  . RADIOACTIVE SEED GUIDED MASTECTOMY WITH AXILLARY SENTINEL LYMPH NODE BIOPSY Right 06/27/2015   Procedure: RADIOACTIVE SEED GUIDED PARTIAL MASTECTOMY WITH AXILLARY SENTINEL LYMPH NODE BIOPSY;  Surgeon: Taylor Overall, MD;  Location:  [facility];  Service: General;  Laterality: Right;  . RADIOACTIVE SEED GUIDED  PARTIAL MASTECTOMY/AXILLARY SENTINEL NODE BIOPSY/AXILLARY NODE DISSECTION Right 06/27/2015   Procedure: RADIOACTIVE SEED GUIDED PARTIAL MASTECTOMY WITH AXILLARY SENTINEL LYMPH NODE BIOPSY AND AXILLARY LYMPH NODE DISSECTION;  Surgeon: Taylor Overall, MD;  Location: [facility];  Service: General;  Laterality: Right;    FAMILY HISTORY Family History  Problem Relation Age of Onset  . Diabetes Mother   . Hyperlipidemia Mother   . Breast cancer Cousin   As of March 2017 the patient's parents are still living, her father age 32 and her mother age 40. The patient has 2 brothers and 2 sisters. One maternal cousin was diagnosed with breast cancer in her late 87s.  GYNECOLOGIC HISTORY:  Patient's last menstrual period was 11/10/2015 (exact date). Menarche age 80, first live birth age 35, which the patient understands increases the risk of breast cancer. She is GX P2. She stopped having periods in December 2016 and used hormone replacement for approximately 2 months. She used oral contraceptives remotely for approximately 10 years.  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 The patient is originally from the [place]. Her husband Taylor Zamora is originally from [place]. Their daughter Taylor Zamora and son Taylor Zamora are currently 73 and 12, respectively.    ADVANCED DIRECTIVES: Not in place   HEALTH MAINTENANCE: Social History  Substance Use Topics  . Smoking status: Never Smoker  . Smokeless tobacco: Never Used  . Alcohol use No     Colonoscopy:Never  PAP:  Bone density: Remote  Lipid panel:  No Known Allergies  Current Outpatient Prescriptions  Medication Sig Dispense Refill  . calcium-vitamin D 250-100 MG-UNIT tablet Take 1 tablet by mouth daily.    . Cyanocobalamin (VITAMIN B 12 PO) Take 1 tablet by mouth daily.    . Multiple Vitamin (MULTIVITAMIN) tablet Take 1 tablet by mouth daily. Reported on  09/20/2015    . Omega 3 1000 MG CAPS Take by mouth. Reported on 09/20/2015    . Probiotic Product (PROBIOTIC PO) Take by mouth. Reported on 09/20/2015    . tamoxifen (NOLVADEX) 20 MG tablet Take 1 tablet (20 mg total) by mouth daily. 90 tablet 12  . temazepam (RESTORIL) 15 MG capsule Take 1 capsule (15 mg total) by mouth at bedtime as needed for sleep. 30 capsule 0   No current facility-administered medications for this visit.     OBJECTIVE: Young-appearing Filipino woman in no acute distress There were no vitals filed for this visit.   There is no height or weight on file to calculate BMI.   ECOG FS:0 - Asymptomatic  Sclerae unicteric, pupils round and equal Oropharynx clear and moist-- no thrush or other lesions No cervical or supraclavicular adenopathy Lungs no rales or rhonchi Heart regular rate and rhythm Abd soft, nontender, positive bowel sounds MSK no focal spinal tenderness, no upper extremity lymphedema Neuro: nonfocal, well oriented, appropriate affect Breasts: The right breast is status post lumpectomy. The incision is healing very nicely. There is minimal residual hyperpigmentation from the radiation. There is no evidence of disease recurrence. The right axilla is benign. Left breast is unremarkable.  LAB RESULTS:  CMP     Component Value Date/Time   NA 140 05/11/2015 0841   K 3.5 05/11/2015 0841   CL 106 01/02/2007 1436   CO2 25 05/11/2015 0841   GLUCOSE 87 05/11/2015 0841   BUN  16.6 05/11/2015 0841   CREATININE 0.7 05/11/2015 0841   CALCIUM 9.0 05/11/2015 0841   PROT 7.4 05/11/2015 0841   ALBUMIN 3.9 05/11/2015 0841   AST 17 05/11/2015 0841   ALT 13 05/11/2015 0841   ALKPHOS 52 05/11/2015 0841   BILITOT 0.41 05/11/2015 0841   GFRNONAA 99 01/02/2007 1436   GFRAA 119 01/02/2007 1436    INo results found for: SPEP, UPEP  Lab Results  Component Value Date   WBC 5.3 05/11/2015   NEUTROABS 2.4 05/11/2015   HGB 13.0 05/11/2015   HCT 37.5 05/11/2015   MCV  88.9 05/11/2015   PLT 270 05/11/2015      Chemistry      Component Value Date/Time   NA 140 05/11/2015 0841   K 3.5 05/11/2015 0841   CL 106 01/02/2007 1436   CO2 25 05/11/2015 0841   BUN 16.6 05/11/2015 0841   CREATININE 0.7 05/11/2015 0841      Component Value Date/Time   CALCIUM 9.0 05/11/2015 0841   ALKPHOS 52 05/11/2015 0841   AST 17 05/11/2015 0841   ALT 13 05/11/2015 0841   BILITOT 0.41 05/11/2015 0841       No results found for: LABCA2  No components found for: LABCA125  No results for input(s): INR in the last 168 hours.  Urinalysis    Component Value Date/Time   COLORURINE YELLOW 01/02/2007 1436   APPEARANCEUR Clear 01/02/2007 1436   LABSPEC > OR = 1.030 01/02/2007 1436   PHURINE 5.5 01/02/2007 1436   GLUCOSEU NEGATIVE 01/02/2007 1436   BILIRUBINUR NEGATIVE 01/02/2007 1436   KETONESUR NEGATIVE 01/02/2007 1436   UROBILINOGEN 0.2 mg/dL 01/02/2007 1436   NITRITE Negative 01/02/2007 1436   LEUKOCYTESUR Negative 01/02/2007 1436      ELIGIBLE FOR AVAILABLE RESEARCH PROTOCOL: PALLAS  STUDIES: No results found.  ASSESSMENT: 50 y.o. [place] woman status post right breast upper inner quadrant biopsy 04/29/2015 for a clinical T1b N0, stage IA  invasive ductal carcinoma, grade 2, estrogen and progesterone receptor positive, HER-2 nonamplified, with an MIB-1 of 5%.  (1) status post right lumpectomy and sentinel lymph node sampling 06/27/2015 for a pT2 pN0, stage IIA invasive lobular carcinoma, grade 1, with negative margins, again HER-2 negative  (2) Oncotype DX score of 10 predicts an outside the breast risk of recurrence within 10 years of 7% if the patient's only systemic treatment is tamoxifen for 5 years. It also predicts no significant benefit from chemotherapy  (3) adjuvant radiation 08/11/2015-09/30/2015:  (1) Right Breast: 50.4 Gy in 28 fractions.              (2) Right Breast Boost: 10 Gy in 5 fractions.   (4) tamoxifen started 11/25/2015  (5)  consider the PALLAS trial   PLAN: I spent a little over 30 minutes today with the NG going over her breast cancer details. She understands that by 1 mm the stage of her breast cancer was classified to 2. She is really on the borderline between stage IA and IIA. We went over what the Oncotype tells [place] and basically if all she does this take tamoxifen for 5 years she will have a 93% chance of her breast cancer not recurring outside the breast within the next 10 years. This is very favorable.  We discussed the possible toxicities, side effects and complications of tamoxifen and we particularly emphasized the fact that she is already having menopausal symptoms and that while tamoxifen may worsen hot flashes it does not cause insomnia  weight gain or mood changes.  I have strongly encouraged her to exercise regularly. If she has significant hot flashes problems we can consider venlafaxine or gabapentin, but at this time she wants to simply try tamoxifen "straight" and see how she tolerates it.  Accordingly I have entered the prescription for her. She will see me again in November. If she is tolerating it well we will continue it for about 3 years and then consider whether she wishes to switch to an aromatase inhibitor.  She has started herself on some supplements which she brought today. These include calcium and vitamin D as well as Krill oil. I don't have any problem with any of those. I affirmed her plan to bring any additional medicines that she wishes to try to her visits here so we can discuss them.  She has a good understanding of this plan. She knows a goal of treatment in her case is cure. She will call with any problems that may develop before her next visit   Taylor Cruel, MD   11/26/2015 6:46 PM Medical Oncology and Hematology [facility] [place], [place] [address] Tel. [phone]    Fax. [phone]

## 2015-11-25 NOTE — Progress Notes (Signed)
CLINIC:  Survivorship   REASON FOR VISIT:  Routine follow-up post-treatment for a recent history of breast cancer.  BRIEF ONCOLOGIC HISTORY:    Breast cancer of upper-inner quadrant of right female breast ([place])   02/23/2015 Mammogram    (R) breast (12:00) with suspicious area of distortion.  Ultrasound shows ill-defined shadowing mass measuring approx 7-8 mm. (R) axilla without suspicious adenopathy.        04/29/2015 Initial Biopsy    (R) breast needle biopsy (12:30): IDC, grade 2.  ER+ (80%), PR+ (90%), Ki67 5%, HER2 neg (ratio 1.14).       05/03/2015 Initial Diagnosis    Breast cancer of upper-inner quadrant of right female breast ([place])     06/27/2015 Surgery    (R) lumpectomy with SLNB Lucia Gaskins): Invasive lobular carcinoma, grade 1, spanning 2.1 cm, also LCIS. 0/1 SLN.  pT2,pN0: Stage IIA.       06/27/2015 Oncotype testing    RS: 10; 7% ROR (low-risk)      08/11/2015 - 09/30/2015 Radiation Therapy    Adjuvant breast radiation ([place]). Right Breast: 50.4 Gy in 28 fractions. Right Breast Boost: 10 Gy in 5 fractions.      11/2015 -  Anti-estrogen oral therapy    (has not started therapy yet; discussion with Dr. Jana Hakim 11/25/15. Plan will likely be Tamoxifen 20 mg daily. Planned duration of therapy 5-10 years.         INTERVAL HISTORY:  Ms. Gorby presents to the [facility] today for our initial meeting to review her survivorship care plan detailing her treatment course for breast cancer, as well as monitoring long-term side effects of that treatment, education regarding health maintenance, screening, and overall wellness and health promotion.     Overall, Ms. Homesley reports feeling relatively well since completing her radiation therapy approximately 2 months ago.  Her biggest concern today is insomnia and associated fatigue. She has struggled with insomnia for quite some time, even before her breast cancer diagnosis.   She feels like her days and nights are mixed up  and she has racing thoughts at bedtime.  She has tried taking OTC Melatonin and has even doubled the dose recently which has not helped her sleep at all.  She does not like taking pills, but states "something has got to give because I really need to get some rest."   She has no libido and it is affecting her intimacy/relationship with her husband. She is concerned about bilateral axillary edema and ankle edema, which comes and goes, even before her breast cancer diagnosis.    She has hot flashes; she has not started any anti-estrogen therapy yet. She has an appointment with Dr. Jana Hakim today after this appointment.  She is trying to get back into exercising; she recently joined [employer].     REVIEW OF SYSTEMS:  Review of Systems  Constitutional: Positive for malaise/fatigue.  HENT: Negative.   Eyes: Negative.   Respiratory: Negative.   Cardiovascular:       Ankle edema (s/p old injury)  Bilateral axillary edema (intermittent)   Gastrointestinal: Negative.   Genitourinary: Negative.   Musculoskeletal: Negative.   Skin: Negative.   Neurological: Negative.   Endo/Heme/Allergies:       (+) hot flashes.   Psychiatric/Behavioral: The patient has insomnia.   GU: Recently had her menstrual cycle earlier this month (11/2015); her periods are irregular.   Breast: Denies any new nodularity, masses, tenderness, nipple changes, or nipple discharge.    A 14-point  review of systems was completed and was negative, except as noted above.   ONCOLOGY TREATMENT TEAM:  1. Surgeon:  Dr. Lucia Gaskins at [facility] 2. Medical Oncologist: Dr. Jana Hakim 3. Radiation Oncologist: Dr. Sondra Come    PAST MEDICAL/SURGICAL HISTORY:  Past Medical History:  Diagnosis Date  . Allergic rhinitis    hay fever  . Breast cancer ([place])   . Breast cancer of upper-inner quadrant of right female breast ([place]) 05/03/2015  . Food allergy    carrots, celery, kiwi, apple, peanuts  . GERD (gastroesophageal  reflux disease)   . Headache(784.0)   . Hyperlipidemia    Past Surgical History:  Procedure Laterality Date  . CESAREAN SECTION  2000,2004   x 2  . RADIOACTIVE SEED GUIDED MASTECTOMY WITH AXILLARY SENTINEL LYMPH NODE BIOPSY Right 06/27/2015   Procedure: RADIOACTIVE SEED GUIDED PARTIAL MASTECTOMY WITH AXILLARY SENTINEL LYMPH NODE BIOPSY;  Surgeon: Alphonsa Overall, MD;  Location: [facility];  Service: General;  Laterality: Right;  . RADIOACTIVE SEED GUIDED PARTIAL MASTECTOMY/AXILLARY SENTINEL NODE BIOPSY/AXILLARY NODE DISSECTION Right 06/27/2015   Procedure: RADIOACTIVE SEED GUIDED PARTIAL MASTECTOMY WITH AXILLARY SENTINEL LYMPH NODE BIOPSY AND AXILLARY LYMPH NODE DISSECTION;  Surgeon: Alphonsa Overall, MD;  Location: [facility];  Service: General;  Laterality: Right;     ALLERGIES:  No Known Allergies   CURRENT MEDICATIONS:  Outpatient Encounter Prescriptions as of 11/25/2015  Medication Sig  . calcium-vitamin D 250-100 MG-UNIT tablet Take 1 tablet by mouth daily.  . Cyanocobalamin (VITAMIN B 12 PO) Take 1 tablet by mouth daily.  . Multiple Vitamin (MULTIVITAMIN) tablet Take 1 tablet by mouth daily. Reported on 09/20/2015  . Omega 3 1000 MG CAPS Take by mouth. Reported on 09/20/2015  . Probiotic Product (PROBIOTIC PO) Take by mouth. Reported on 09/20/2015  . temazepam (RESTORIL) 15 MG capsule Take 1 capsule (15 mg total) by mouth at bedtime as needed for sleep.  . [DISCONTINUED] hyaluronate sodium (RADIAPLEXRX) GEL Apply 1 application topically 2 (two) times daily. Reported on 09/20/2015  . [DISCONTINUED] HYDROcodone-acetaminophen (NORCO/VICODIN) 5-325 MG tablet Take 1-2 tablets by mouth every 6 (six) hours as needed. (Patient not taking: Reported on 11/25/2015)  . [DISCONTINUED] non-metallic deodorant (ALRA) MISC Apply 1 application topically daily as needed. Reported on 09/20/2015  . [DISCONTINUED] Wound Dressings (SONAFINE EX) Apply topically.   No  facility-administered encounter medications on file as of 11/25/2015.      ONCOLOGIC FAMILY HISTORY:  Family History  Problem Relation Age of Onset  . Diabetes Mother   . Hyperlipidemia Mother   . Breast cancer Cousin      GENETIC COUNSELING/TESTING: None.  SOCIAL HISTORY:  CHEA MALAN is married and lives with her husband and 2 children in [place], [place]. Her children are 37 and 17. 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  She denies any current tobacco, alcohol, or illicit drug use.     PHYSICAL EXAMINATION:  Vital Signs:   Vitals:   11/25/15 1121  BP: 126/77  Pulse: 65  Resp: 18  Temp: 98.3 F (36.8 C)   Filed Weights   11/25/15 1121  Weight: 133 lb 4.8 oz (60.5 kg)   General: Well-nourished, well-appearing female in no acute distress.  She is unaccompanied today.   HEENT: Head is normocephalic.  Sclerae anicteric.  Respiratory:  Breathing non-labored.  Neuro: No focal deficits. Steady gait.  Psych: Mood and affect normal and appropriate for situation.    LABORATORY DATA:  None for this visit.  DIAGNOSTIC IMAGING:  None for this visit.      ASSESSMENT AND PLAN:  Ms.. Vanevery is a pleasant 50 y.o. female with Stage IIA right breast invasive lobular carcinoma, ER+/PR+/HER2-, diagnosed in 04/2015;  treated with lumpectomy and adjuvant radiation therapy. Anti-estrogen therapy is pending based on upcoming conversation with Dr. Jana Hakim today. She presents to the Survivorship Clinic for our initial meeting and routine follow-up post-completion of treatment for breast cancer.    1. Stage IIA right breast cancer:  Ms. Schoenbeck is continuing to recover from definitive treatment for breast cancer. She will see Dr. Jana Hakim today for further discussion regarding anti-estrogen therapy, which will likely consist of Tamoxifen.  We briefly discussed the difference between Tamoxifen and aromatase inhibitors and their respective side effects. She will continue  to follow-up with Dr. Jana Hakim and Dr. Lucia Gaskins for continued surveillance of her breast cancer.  Today, a comprehensive survivorship care plan and treatment summary was reviewed with the patient today detailing her breast cancer diagnosis, treatment course, potential late/long-term effects of treatment, appropriate follow-up care with recommendations for the future, and patient education resources.  A copy of this summary, along with a letter will be sent to the patient's primary care provider via mail/fax/In Basket message after today's visit.    2. 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 Mehl has really been struggling with insomnia. We discussed that sleep disturbances are very common during pre-menopause (which is where I think she may be now given her irregular menses).  She has tried Melatonin OTC and that has been ineffective.  She tells me that has taken Benadryl in the past, which has helped her allergies and made her sleepy.  She wants to know if she can try that to help her sleep.  I let her know that it would be completely safe for her to take Benadryl to help her sleep. I also offered to give her a prescription for Temazepam, as much of her insomnia seems to be related to anxiety.  We discussed that if the Benadryl is ineffective in helping her go to sleep and stay asleep, then she could try the temazepam, which would likely give her some restful sleep.  Prescription for Temazepam 15 mg po QHSprn, #30, no refills provided to the patient today.  I gave her strict instructions not to take the Benadryl and Temazepam at the same time, due to risk of oversedation.  She voiced understanding and agreed with this plan.    3. Bone health:  Given Ms. Paulos's history of breast cancer, she is at risk for bone demineralization.  We discussed that if Dr. Jana Hakim chooses Tamoxifen for her anti-estrogen therapy, that there will be a positive side effect of increased bone strength.  She already is  taking calcium and vitamin D supplementation, which I encouraged her to continue.  She has not had DEXA scan; she will be eligible if she is started on aromatase inhibitors.  I will defer any future bone mineral density imaging to Dr. Jana Hakim, as clinically indicated.   In the meantime, she was encouraged to increase her consumption of foods rich in calcium, as well as increase her weight-bearing activities.  She was given education on specific activities to promote bone health.  4. Cancer screening:  Due to Ms. Rosenboom's history and her age, she should receive screening for skin cancers, colon cancer, and gynecologic cancers.  The information and recommendations are listed on the patient's comprehensive care plan/treatment summary and were reviewed in detail with the  patient.    5. Health maintenance and wellness promotion: Ms. Rosario was encouraged to consume 5-7 servings of fruits and vegetables per day. We reviewed the "Nutrition Rainbow" handout. She was also encouraged to engage in moderate to vigorous exercise for 30 minutes per day most days of the week. We discussed the [employer] fitness program, which is designed for cancer survivors to help them become more physically fit after cancer treatments.  She was instructed to limit her alcohol consumption and continue to abstain from tobacco use.    6. Support services/counseling: It is not uncommon for this period of the patient's cancer care trajectory to be one of many emotions and stressors.  We discussed an opportunity for her to participate in the next session of [facility] ("Finding Your New Normal") support group series designed for patients after they have completed treatment.   Ms. Bergey was encouraged to take advantage of our many other support services programs, support groups, and/or counseling in coping with her new life as a cancer survivor after completing anti-cancer treatment.  She was offered support today through active listening and  expressive supportive counseling.  She was given information regarding our available services and encouraged to contact me with any questions or for help enrolling in any of our support group/programs.    Dispo:   -See Dr. Jana Hakim today following this visit; she will follow-up with him at his discretion.   -She is welcome to return back to the Survivorship Clinic at any time; no additional follow-up needed at this time.  -Consider referral back to survivorship as a long-term survivor for continued surveillance  A total of 35 minutes of face-to-face time was spent with this patient with greater than 50% of that time in counseling and care-coordination.   Mike Craze, NP Survivorship Program [place] [phone]  (Coding/Billing notation: This patient is a no charge.  She was seen by Dr. Jana Hakim today as well. (Only 1 provider from same department may bill for same day of service).    Note: PRIMARY CARE PROVIDER Antony Blackbird, [place] [phone]

## 2015-11-26 MED ORDER — TAMOXIFEN CITRATE 20 MG PO TABS: 20.0000 mg | ORAL_TABLET | Freq: Every day | ORAL | 12 refills | Status: AC

## 2016-02-02 ENCOUNTER — Other Ambulatory Visit (HOSPITAL_BASED_OUTPATIENT_CLINIC_OR_DEPARTMENT_OTHER): Payer: BLUE CROSS/BLUE SHIELD

## 2016-02-02 ENCOUNTER — Ambulatory Visit (HOSPITAL_BASED_OUTPATIENT_CLINIC_OR_DEPARTMENT_OTHER): Payer: BLUE CROSS/BLUE SHIELD | Admitting: Oncology

## 2016-02-02 VITALS — BP 126/68 | HR 67 | Temp 98.3°F | Resp 18 | Ht 59.0 in | Wt 129.5 lb

## 2016-02-02 DIAGNOSIS — C50211 Malignant neoplasm of upper-inner quadrant of right female breast: Secondary | ICD-10-CM

## 2016-02-02 DIAGNOSIS — Z170 Estrogen receptor positive status [ER+]: Secondary | ICD-10-CM | POA: Diagnosis not present

## 2016-02-02 DIAGNOSIS — G4700 Insomnia, unspecified: Secondary | ICD-10-CM | POA: Diagnosis not present

## 2016-02-02 LAB — COMPREHENSIVE METABOLIC PANEL
ALBUMIN: 3.6 g/dL (ref 3.5–5.0)
ALK PHOS: 41 U/L (ref 40–150)
ALT: 11 U/L (ref 0–55)
AST: 18 U/L (ref 5–34)
Anion Gap: 7 mEq/L (ref 3–11)
BILIRUBIN TOTAL: 0.31 mg/dL (ref 0.20–1.20)
BUN: 16.2 mg/dL (ref 7.0–26.0)
CO2: 26 mEq/L (ref 22–29)
CREATININE: 0.8 mg/dL (ref 0.6–1.1)
Calcium: 9 mg/dL (ref 8.4–10.4)
Chloride: 110 mEq/L — ABNORMAL HIGH (ref 98–109)
EGFR: 88 mL/min/{1.73_m2} — ABNORMAL LOW (ref >90)
GLUCOSE: 161 mg/dL — AB (ref 70–140)
Potassium: 3.4 mEq/L — ABNORMAL LOW (ref 3.5–5.1)
SODIUM: 144 meq/L (ref 136–145)
TOTAL PROTEIN: 7.2 g/dL (ref 6.4–8.3)

## 2016-02-02 LAB — CBC WITH DIFFERENTIAL/PLATELET
BASO%: 0.5 % (ref 0.0–2.0)
Basophils Absolute: 0 10*3/uL (ref 0.0–0.1)
EOS ABS: 0.2 10*3/uL (ref 0.0–0.5)
EOS%: 4.8 % (ref 0.0–7.0)
HEMATOCRIT: 35.4 % (ref 34.8–46.6)
HEMOGLOBIN: 12.3 g/dL (ref 11.6–15.9)
LYMPH#: 1.5 10*3/uL (ref 0.9–3.3)
LYMPH%: 37.5 % (ref 14.0–49.7)
MCH: 30.8 pg (ref 25.1–34.0)
MCHC: 34.7 g/dL (ref 31.5–36.0)
MCV: 88.7 fL (ref 79.5–101.0)
MONO#: 0.3 10*3/uL (ref 0.1–0.9)
MONO%: 8.6 % (ref 0.0–14.0)
NEUT%: 48.6 % (ref 38.4–76.8)
NEUTROS ABS: 1.9 10*3/uL (ref 1.5–6.5)
Platelets: 218 10*3/uL (ref 145–400)
RBC: 3.99 10*6/uL (ref 3.70–5.45)
RDW: 12 % (ref 11.2–14.5)
WBC: 4 10*3/uL (ref 3.9–10.3)

## 2016-02-02 MED ORDER — TAMOXIFEN CITRATE 20 MG PO TABS: 20.0000 mg | ORAL_TABLET | Freq: Every day | ORAL | 12 refills | Status: DC

## 2016-02-02 MED ORDER — ZOLPIDEM TARTRATE ER 6.25 MG PO TBCR: 6.2500 mg | EXTENDED_RELEASE_TABLET | Freq: Every evening | ORAL | 0 refills | Status: DC | PRN

## 2016-02-02 NOTE — Progress Notes (Signed)
Taylor Zamora  Telephone:([phone]) [phone] Fax:(336) [phone]     ID: Taylor Zamora DOB: 04-20-1965  MR#: [id number]  KNL#:[id number]  Patient Care Team: Antony Blackbird, MD as PCP - General (Family Medicine) Alphonsa Overall, MD as Consulting Physician (General Surgery) Chauncey Cruel, MD as Consulting Physician (Oncology) Sylvan Cheese, NP as Nurse Practitioner (Hematology and Oncology) Everlene Farrier, MD as Consulting Physician (Obstetrics and Gynecology) Gery Pray, MD as Consulting Physician (Radiation Oncology) PCP: Antony Blackbird, MD OTHER MD:  CHIEF COMPLAINT: Estrogen receptor positive breast cancer  CURRENT TREATMENT: Tamoxifen   BREAST CANCER HISTORY: From the original intake note:  "Taylor Zamora" had bilateral screening mammography at the [facility] 02/16/2015. This suggested a possible asymmetry in the right breast. On 02/23/2015 the patient underwent right diagnostic mammography with tomosynthesis and right breast ultrasonography. The breast density was category C. In the upper right breast there was an area of distortion which was palpable at approximately 12:30 o'clock. Ultrasound found an ill-defined mass measuring 8 mm. Ultrasound of the right axilla was benign.  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  Her subsequent history is as detailed below  INTERVAL HISTORY: Taylor Zamora returns today for follow-up of her estrogen receptor positive breast cancer. She started tamoxifen little over 2 months ago. So far she is tolerating it very well. She has really noted no change at all in any symptoms. She does have some hot flashes and some vaginal wetness issues but these are  not any different than before she tells me. She is getting the drug and approximately $3 a month  REVIEW OF SYSTEMS: The biggest problem she is having is insomnia. She tried Restoril but it didn't work. She has tried Advil PM and NyQuil Z as well as melatonin. She goes to bed somewhere between 8 and 10 PM since she is usually very tired, but then can go to sleep she tells me the room as cold but she has an electric blanket. She does not keep the window open. She is usually out of 5 7:15 in the morning but has been awake for some time. Aside from the insomnia problem, she complains of blurred vision, chest discomfort, shortness of breath when walking up stairs, and weakness and numbness very intermittently. A detailed review of systems today was otherwise stable.  PAST MEDICAL HISTORY: Past Medical History:  Diagnosis Date  . Allergic rhinitis    hay fever  . Breast cancer ([place])   . Breast cancer of upper-inner quadrant of right female breast ([place]) 05/03/2015  . Food allergy    carrots, celery, kiwi, apple, peanuts  . GERD (gastroesophageal reflux disease)   . Headache(784.0)   . Hyperlipidemia     PAST SURGICAL HISTORY: Past Surgical History:  Procedure Laterality Date  . CESAREAN SECTION  2000,2004   x 2  . RADIOACTIVE SEED GUIDED MASTECTOMY WITH AXILLARY SENTINEL LYMPH NODE BIOPSY Right 06/27/2015   Procedure: RADIOACTIVE SEED GUIDED PARTIAL MASTECTOMY WITH AXILLARY SENTINEL LYMPH NODE BIOPSY;  Surgeon: Alphonsa Overall, MD;  Location: [facility];  Service: General;  Laterality: Right;  . RADIOACTIVE SEED GUIDED PARTIAL MASTECTOMY/AXILLARY SENTINEL NODE BIOPSY/AXILLARY NODE DISSECTION Right 06/27/2015   Procedure: RADIOACTIVE SEED GUIDED PARTIAL MASTECTOMY WITH AXILLARY SENTINEL LYMPH NODE  BIOPSY AND AXILLARY LYMPH NODE DISSECTION;  Surgeon: Alphonsa Overall, MD;  Location: [facility];  Service: General;  Laterality: Right;    FAMILY HISTORY Family History    Problem Relation Age of Onset  . Diabetes Mother   . Hyperlipidemia Mother   . Breast cancer Cousin   As of March 2017 the patient's parents are still living, her father age 49 and her mother age 11. The patient has 2 brothers and 2 sisters. One maternal cousin was diagnosed with breast cancer in her late 66s.  GYNECOLOGIC HISTORY:  No LMP recorded. Menarche age 29, first live birth age 27, which the patient understands increases the risk of breast cancer. She is GX P2. She stopped having periods in December 2016 and used hormone replacement for approximately 2 months. She used oral contraceptives remotely for approximately 10 years.  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 The patient is originally from the [place]. Her husband Taylor Zamora is originally from [place]. Their daughter Taylor Zamora and son Taylor Zamora are currently 40 and 12, respectively -- unfortunately at the November 2017 visit the patient tells me she had her husband are separating.    ADVANCED DIRECTIVES: Not in place   HEALTH MAINTENANCE: Social History  Substance Use Topics  . Smoking status: Never Smoker  . Smokeless tobacco: Never Used  . Alcohol use No     Colonoscopy:Never  PAP:  Bone density: Remote  Lipid panel:  No Known Allergies  Current Outpatient Prescriptions  Medication Sig Dispense Refill  . calcium-vitamin D 250-100 MG-UNIT tablet Take 1 tablet by mouth daily.    . Cyanocobalamin (VITAMIN B 12 PO) Take 1 tablet by mouth daily.    . Omega 3 1000 MG CAPS Take by mouth. Reported on 09/20/2015    . tamoxifen (NOLVADEX) 20 MG tablet Take 1 tablet (20 mg total) by mouth daily. 90 tablet 12   No current facility-administered medications for this visit.     OBJECTIVE: Young-appearing Filipino woman Who appears well Vitals:   02/02/16 1415  BP: 126/68  Pulse: 67  Resp: 18  Temp: 98.3 F (36.8 C)     Body mass index is 26.16 kg/m.    ECOG FS:0 - Asymptomatic  Sclerae unicteric, EOMs intact Oropharynx clear and moist No cervical or supraclavicular adenopathy Lungs no rales or rhonchi Heart regular rate and rhythm Abd soft, nontender, positive bowel sounds MSK no focal spinal tenderness, no upper extremity lymphedema Neuro: nonfocal, well oriented, appropriate affect Breasts: The right breast is status post lumpectomy and radiation. There is still some hyperpigmentation. There is no evidence of local recurrence. The right axilla is benign. Left breast is unremarkable.  LAB RESULTS:  CMP     Component Value Date/Time   NA 144 02/02/2016 1357   K 3.4 (L) 02/02/2016 1357   CL 106 01/02/2007 1436   CO2 26 02/02/2016 1357   GLUCOSE 161 (H) 02/02/2016 1357   BUN 16.2 02/02/2016 1357   CREATININE 0.8 02/02/2016 1357   CALCIUM 9.0 02/02/2016 1357   PROT 7.2 02/02/2016 1357   ALBUMIN 3.6 02/02/2016 1357   AST 18 02/02/2016 1357   ALT 11 02/02/2016 1357   ALKPHOS 41 02/02/2016 1357   BILITOT 0.31 02/02/2016 1357   GFRNONAA 99 01/02/2007 1436   GFRAA 119 01/02/2007 1436    INo results found for: SPEP, UPEP  Lab Results  Component Value Date   WBC 4.0 02/02/2016  NEUTROABS 1.9 02/02/2016   HGB 12.3 02/02/2016   HCT 35.4 02/02/2016   MCV 88.7 02/02/2016   PLT 218 02/02/2016      Chemistry      Component Value Date/Time   NA 144 02/02/2016 1357   K 3.4 (L) 02/02/2016 1357   CL 106 01/02/2007 1436   CO2 26 02/02/2016 1357   BUN 16.2 02/02/2016 1357   CREATININE 0.8 02/02/2016 1357      Component Value Date/Time   CALCIUM 9.0 02/02/2016 1357   ALKPHOS 41 02/02/2016 1357   AST 18 02/02/2016 1357   ALT 11 02/02/2016 1357   BILITOT 0.31 02/02/2016 1357       No results found for: LABCA2  No components found for: LABCA125  No results for input(s): INR in the last 168 hours.  Urinalysis    Component Value Date/Time   COLORURINE YELLOW 01/02/2007 1436   APPEARANCEUR Clear 01/02/2007 1436    LABSPEC > OR = 1.030 01/02/2007 1436   PHURINE 5.5 01/02/2007 1436   GLUCOSEU NEGATIVE 01/02/2007 1436   BILIRUBINUR NEGATIVE 01/02/2007 1436   KETONESUR NEGATIVE 01/02/2007 1436   UROBILINOGEN 0.2 mg/dL 01/02/2007 1436   NITRITE Negative 01/02/2007 1436   LEUKOCYTESUR Negative 01/02/2007 1436      ELIGIBLE FOR AVAILABLE RESEARCH PROTOCOL: [place]  STUDIES: No results found.  ASSESSMENT: 50 y.o. [place] woman status post right breast upper inner quadrant biopsy 04/29/2015 for a clinical T1b N0, stage IA  invasive ductal carcinoma, grade 2, estrogen and progesterone receptor positive, HER-2 nonamplified, with an MIB-1 of 5%.  (1) status post right lumpectomy and sentinel lymph node sampling 06/27/2015 for a pT2 pN0, stage IIA invasive lobular carcinoma, grade 1, with negative margins, again HER-2 negative  (2) Oncotype DX score of 10 predicts an outside the breast risk of recurrence within 10 years of 7% if the patient's only systemic treatment is tamoxifen for 5 years. It also predicts no significant benefit from chemotherapy  (3) adjuvant radiation 08/11/2015-09/30/2015:  (1) Right Breast: 50.4 Gy in 28 fractions.              (2) Right Breast Boost: 10 Gy in 5 fractions.   (4) tamoxifen started 11/25/2015   PLAN: Zack Seal is tolerating tamoxifen well and the plan will be to continue that most likely for 3 years before switching to an aromatase inhibitor for the final 2 years.  She is under a great deal of stress currently because of her home situation. Hopefully this will result in a way that is amicable to all concerned  We discussed insomnia problems at great length today. She has tried many things which have not worked. I gave her quite a bit of information on insomnia causes and behavioral modification. Specifically I have asked her to always get out of bed at the same time every day including weekends. I also suggested she cracked a window open and keep her room very cold  so her face is called even if her body is warm under blankets.  If that doesn't work she is going to try AmbienX R at 6.25 mg.  Otherwise she will see me again in 6 months, after her next mammography. She knows to call for any problems that may develop before that visit.    Chauncey Cruel, MD   02/02/2016 2:32 PM Medical Oncology and Hematology [facility] [address] [place], [place] [address] Tel. [phone]    Fax. [phone]

## 2016-02-03 LAB — VITAMIN D 25 HYDROXY (VIT D DEFICIENCY, FRACTURES): VIT D 25 HYDROXY: 28.8 ng/mL — AB (ref 30.0–100.0)

## 2016-02-04 ENCOUNTER — Encounter: Payer: Self-pay | Admitting: Oncology

## 2016-05-04 ENCOUNTER — Telehealth: Payer: Self-pay | Admitting: *Deleted

## 2016-05-04 NOTE — Telephone Encounter (Signed)
Patient currently taking Ambien for sleep. Patient states that this is not working and would like an alternative sleep aide.

## 2016-05-24 ENCOUNTER — Telehealth: Payer: Self-pay | Admitting: *Deleted

## 2016-05-24 NOTE — Telephone Encounter (Signed)
This RN returned call per pt's VM to discuss her request for other non habit forming sleep medication due to multiple medications used without benefit.  Obtained VM. Message left stating due to multiple medications used with no benefit pt should see her primary MD for follow up to discuss concerns for sleep medications.

## 2016-07-24 ENCOUNTER — Telehealth: Payer: Self-pay

## 2016-07-24 NOTE — Telephone Encounter (Signed)
Spoke with patient and she is aware of her new appt due to bmdc   Taylor Zamora 

## 2016-08-01 ENCOUNTER — Other Ambulatory Visit: Payer: BLUE CROSS/BLUE SHIELD

## 2016-08-01 ENCOUNTER — Ambulatory Visit: Payer: BLUE CROSS/BLUE SHIELD | Admitting: Oncology

## 2016-08-15 ENCOUNTER — Other Ambulatory Visit (HOSPITAL_BASED_OUTPATIENT_CLINIC_OR_DEPARTMENT_OTHER): Payer: BLUE CROSS/BLUE SHIELD

## 2016-08-15 ENCOUNTER — Ambulatory Visit (HOSPITAL_BASED_OUTPATIENT_CLINIC_OR_DEPARTMENT_OTHER): Payer: BLUE CROSS/BLUE SHIELD | Admitting: Oncology

## 2016-08-15 VITALS — BP 121/65 | HR 67 | Temp 98.8°F | Resp 18 | Ht 59.0 in | Wt 128.2 lb

## 2016-08-15 DIAGNOSIS — C50211 Malignant neoplasm of upper-inner quadrant of right female breast: Secondary | ICD-10-CM

## 2016-08-15 DIAGNOSIS — Z79811 Long term (current) use of aromatase inhibitors: Secondary | ICD-10-CM | POA: Diagnosis not present

## 2016-08-15 DIAGNOSIS — Z170 Estrogen receptor positive status [ER+]: Secondary | ICD-10-CM

## 2016-08-15 LAB — CBC WITH DIFFERENTIAL/PLATELET
BASO%: 0.8 % (ref 0.0–2.0)
Basophils Absolute: 0 10*3/uL (ref 0.0–0.1)
EOS%: 14.9 % — AB (ref 0.0–7.0)
Eosinophils Absolute: 0.6 10*3/uL — ABNORMAL HIGH (ref 0.0–0.5)
HEMATOCRIT: 36.7 % (ref 34.8–46.6)
HEMOGLOBIN: 12.3 g/dL (ref 11.6–15.9)
LYMPH#: 1.6 10*3/uL (ref 0.9–3.3)
LYMPH%: 40.6 % (ref 14.0–49.7)
MCH: 30.4 pg (ref 25.1–34.0)
MCHC: 33.5 g/dL (ref 31.5–36.0)
MCV: 90.6 fL (ref 79.5–101.0)
MONO#: 0.2 10*3/uL (ref 0.1–0.9)
MONO%: 5.8 % (ref 0.0–14.0)
NEUT%: 37.9 % — ABNORMAL LOW (ref 38.4–76.8)
NEUTROS ABS: 1.5 10*3/uL (ref 1.5–6.5)
PLATELETS: 225 10*3/uL (ref 145–400)
RBC: 4.05 10*6/uL (ref 3.70–5.45)
RDW: 12.3 % (ref 11.2–14.5)
WBC: 3.8 10*3/uL — ABNORMAL LOW (ref 3.9–10.3)

## 2016-08-15 LAB — COMPREHENSIVE METABOLIC PANEL
ALBUMIN: 3.7 g/dL (ref 3.5–5.0)
ALT: 10 U/L (ref 0–55)
AST: 18 U/L (ref 5–34)
Alkaline Phosphatase: 34 U/L — ABNORMAL LOW (ref 40–150)
Anion Gap: 8 mEq/L (ref 3–11)
BILIRUBIN TOTAL: 0.48 mg/dL (ref 0.20–1.20)
BUN: 18 mg/dL (ref 7.0–26.0)
CALCIUM: 9 mg/dL (ref 8.4–10.4)
CHLORIDE: 110 meq/L — AB (ref 98–109)
CO2: 25 mEq/L (ref 22–29)
CREATININE: 0.8 mg/dL (ref 0.6–1.1)
EGFR: >90 mL/min/{1.73_m2} (ref >90)
Glucose: 99 mg/dl (ref 70–140)
Potassium: 3.9 mEq/L (ref 3.5–5.1)
Sodium: 144 mEq/L (ref 136–145)
TOTAL PROTEIN: 7 g/dL (ref 6.4–8.3)

## 2016-08-15 MED ORDER — TAMOXIFEN CITRATE 20 MG PO TABS
20.0000 mg | ORAL_TABLET | Freq: Every day | ORAL | 12 refills | Status: AC
Start: 2016-08-15 — End: 2016-09-14

## 2016-08-15 MED ORDER — GABAPENTIN 300 MG PO CAPS: 300.0000 mg | ORAL_CAPSULE | Freq: Every day | ORAL | 4 refills | Status: DC

## 2016-08-15 NOTE — Progress Notes (Signed)
[place]  Telephone:(336) [phone] Fax:(336) [phone]     ID: Taylor Zamora DOB: [birth year]  MR#: [id number]  XNA#:[id number]  Patient Care Team: Taylor Blackbird, MD as PCP - General (Family Medicine) Taylor Overall, MD as Consulting Physician (General Surgery) Taylor Zamora, Taylor Dad, MD as Consulting Physician (Oncology) Taylor Cheese, NP as Nurse Practitioner (Hematology and Oncology) Taylor Farrier, MD as Consulting Physician (Obstetrics and Gynecology) Taylor Pray, MD as Consulting Physician (Radiation Oncology) PCP: Taylor Blackbird, MD OTHER MD:  CHIEF COMPLAINT: Estrogen receptor positive breast cancer  CURRENT TREATMENT: Tamoxifen   BREAST CANCER HISTORY: From the original intake note:  "Taylor Zamora" had bilateral screening mammography at the [facility] 02/16/2015. This suggested a possible asymmetry in the right breast. On 02/23/2015 the patient underwent right diagnostic mammography with tomosynthesis and right breast ultrasonography. The breast density was category C. In the upper right breast there was an area of distortion which was palpable at approximately 12:30 o'clock. Ultrasound found an ill-defined mass measuring 8 mm. Ultrasound of the right axilla was benign.  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  Her subsequent history is as detailed below  INTERVAL HISTORY: Taylor Zamora returns today for follow-up of her estrogen receptor positive breast cancer. She continues on tamoxifen, with good tolerance. Her hot flashes are "not bad". There are little worse in the evening. She does not have significant problems with vaginal dryness. She obtains it at a good  price.   REVIEW OF SYSTEMS: At the last visit the patient told me she and her husband might be separating. They have not done that and are undergoing counseling. There are still significant issues to be resolved. Quite aside from that she worries about her husband's health because he does not exercise, works very late hours, and has gained a lot of weight she says. This is a source of concern to her. A detailed review of systems was otherwise stable  PAST MEDICAL HISTORY: Past Medical History:  Diagnosis Date  . Allergic rhinitis    hay fever  . Breast cancer ([place])   . Breast cancer of upper-inner quadrant of right female breast ([place]) 05/03/2015  . Food allergy    carrots, celery, kiwi, apple, peanuts  . GERD (gastroesophageal reflux disease)   . Headache(784.0)   . Hyperlipidemia     PAST SURGICAL HISTORY: Past Surgical History:  Procedure Laterality Date  . CESAREAN SECTION  2000,2004   x 2  . RADIOACTIVE SEED GUIDED MASTECTOMY WITH AXILLARY SENTINEL LYMPH NODE BIOPSY Right 06/27/2015   Procedure: RADIOACTIVE SEED GUIDED PARTIAL MASTECTOMY WITH AXILLARY SENTINEL LYMPH NODE BIOPSY;  Surgeon: Taylor Overall, MD;  Location: [place];  Service: General;  Laterality: Right;  . RADIOACTIVE SEED GUIDED PARTIAL MASTECTOMY/AXILLARY SENTINEL NODE BIOPSY/AXILLARY NODE DISSECTION Right 06/27/2015   Procedure: RADIOACTIVE SEED GUIDED PARTIAL MASTECTOMY WITH AXILLARY SENTINEL LYMPH NODE BIOPSY AND AXILLARY LYMPH NODE DISSECTION;  Surgeon: Taylor Overall, MD;  Location: [place];  Service: General;  Laterality: Right;    FAMILY HISTORY Family History  Problem Relation Age of Onset  . Diabetes Mother   . Hyperlipidemia Mother   . Breast cancer Cousin   As of March 2017 the patient's parents are still living, her father age  24 and her mother age 76. The patient has 2 brothers and 2 sisters. One maternal cousin was diagnosed with breast cancer in her late  63s.  GYNECOLOGIC HISTORY:  No LMP recorded. Menarche age 88, first live birth age 55, which the patient understands increases the risk of breast cancer. She is GX P2. She stopped having periods in December 2016 and used hormone replacement for approximately 2 months. She used oral contraceptives remotely for approximately 10 years.  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 She works at [employer] with her husband. The patient is originally from the [place]. Her husband Taylor Zamora is originally from [place]. Their daughter Taylor Zamora and son Taylor Zamora are currently 26 and 13, respectively. The daughter will be going to [employer] in the fall of 2018. The son has a great interest in [employer]. an Engineer, production and is getting always at school    ADVANCED DIRECTIVES: Not in place   HEALTH MAINTENANCE: Social History  Substance Use Topics  . Smoking status: Never Smoker  . Smokeless tobacco: Never Used  . Alcohol use No     Colonoscopy:Never  PAP:  Bone density: Remote  Lipid panel:  No Known Allergies  Current Outpatient Prescriptions  Medication Sig Dispense Refill  . calcium-vitamin D 250-100 MG-UNIT tablet Take 1 tablet by mouth daily.    . Cyanocobalamin (VITAMIN B 12 PO) Take 1 tablet by mouth daily.    . Omega 3 1000 MG CAPS Take by mouth. Reported on 09/20/2015    . zolpidem (AMBIEN CR) 6.25 MG CR tablet Take 1 tablet (6.25 mg total) by mouth at bedtime as needed for sleep. 30 tablet 0   No current facility-administered medications for this visit.     OBJECTIVE: Young-appearing Filipino woman In no acute distress Vitals:   08/15/16 1018  BP: 121/65  Pulse: 67  Resp: 18  Temp: 98.8 F (37.1 C)     Body mass index is 25.89 kg/m.   ECOG FS:0 - Asymptomatic  Sclerae unicteric, pupils round and equal Oropharynx clear and moist No cervical or supraclavicular adenopathy Lungs no rales or rhonchi Heart regular rate and  rhythm Abd soft, nontender, positive bowel sounds MSK no focal spinal tenderness, no upper extremity lymphedema Neuro: nonfocal, well oriented, appropriate affect Breasts: The right breast is status post lumpectomy followed by radiation with no evidence of local recurrence. There is still some hyperpigmentation with concerns the patient. The left breast is benign. Both axillae are benign.  LAB RESULTS:  CMP     Component Value Date/Time   NA 144 02/02/2016 1357   K 3.4 (L) 02/02/2016 1357   CL 106 01/02/2007 1436   CO2 26 02/02/2016 1357   GLUCOSE 161 (H) 02/02/2016 1357   BUN 16.2 02/02/2016 1357   CREATININE 0.8 02/02/2016 1357   CALCIUM 9.0 02/02/2016 1357   PROT 7.2 02/02/2016 1357   ALBUMIN 3.6 02/02/2016 1357   AST 18 02/02/2016 1357   ALT 11 02/02/2016 1357   ALKPHOS 41 02/02/2016 1357   BILITOT 0.31 02/02/2016 1357   GFRNONAA 99 01/02/2007 1436   GFRAA 119 01/02/2007 1436    INo results found for: SPEP, UPEP  Lab Results  Component Value Date   WBC 3.8 (L) 08/15/2016   NEUTROABS 1.5 08/15/2016   HGB 12.3 08/15/2016   HCT 36.7 08/15/2016   MCV 90.6 08/15/2016   PLT 225 08/15/2016      Chemistry  Component Value Date/Time   NA 144 02/02/2016 1357   K 3.4 (L) 02/02/2016 1357   CL 106 01/02/2007 1436   CO2 26 02/02/2016 1357   BUN 16.2 02/02/2016 1357   CREATININE 0.8 02/02/2016 1357      Component Value Date/Time   CALCIUM 9.0 02/02/2016 1357   ALKPHOS 41 02/02/2016 1357   AST 18 02/02/2016 1357   ALT 11 02/02/2016 1357   BILITOT 0.31 02/02/2016 1357       No results found for: LABCA2  No components found for: LABCA125  No results for input(s): INR in the last 168 hours.  Urinalysis    Component Value Date/Time   COLORURINE YELLOW 01/02/2007 1436   APPEARANCEUR Clear 01/02/2007 1436   LABSPEC > OR = 1.030 01/02/2007 1436   PHURINE 5.5 01/02/2007 1436   GLUCOSEU NEGATIVE 01/02/2007 1436   BILIRUBINUR NEGATIVE 01/02/2007 1436    KETONESUR NEGATIVE 01/02/2007 1436   UROBILINOGEN 0.2 mg/dL 01/02/2007 1436   NITRITE Negative 01/02/2007 1436   LEUKOCYTESUR Negative 01/02/2007 1436      ELIGIBLE FOR AVAILABLE RESEARCH PROTOCOL: no  STUDIES: She is behind on her mammography and the appropriate order has been placed  ASSESSMENT: 51 y.o. [place] woman status post right breast upper inner quadrant biopsy 04/29/2015 for a clinical T1b N0, stage IA  invasive ductal carcinoma, grade 2, estrogen and progesterone receptor positive, HER-2 nonamplified, with an MIB-1 of 5%.  (1) status post right lumpectomy and sentinel lymph node sampling 06/27/2015 for a pT2 pN0, stage IIA invasive lobular carcinoma, grade 1, with negative margins, again HER-2 negative  (2) Oncotype DX score of 10 predicts an outside the breast risk of recurrence within 10 years of 7% if the patient's only systemic treatment is tamoxifen for 5 years. It also predicts no significant benefit from chemotherapy  (3) adjuvant radiation 08/11/2015-09/30/2015:  (1) Right Breast: 50.4 Gy in 28 fractions.              (2) Right Breast Boost: 10 Gy in 5 fractions.   (4) tamoxifen started 11/25/2015   PLAN: Taylor Zamora is now one year out from her definitive surgery for breast cancer with no evidence of disease recurrence. This is favorable.  She tolerates tamoxifen well. The plan will be to continue that for a minimum of 5 years.  I think she would benefit from gabapentin at bedtime. This will help her fall asleep and it also will decrease the nighttime hot flashes. We discussed the possible toxicities, side effects and Occasions and she is willing to give it a try.  She is behind on her mammography I went ahead and placed the order for her. That should be done sometime this week.  She tells me she is going to have her Pap smear within the next 2 months  She will see me again in 6 months. At that point I will probably start seeing her on a once a year  basis.  She knows to call for any problems that may develop before then    Chauncey Cruel, MD   08/15/2016 10:35 AM Medical Oncology and Hematology [facility] [place], [place] [address] Tel. [phone]    Fax. [phone]

## 2016-09-11 ENCOUNTER — Other Ambulatory Visit: Payer: Self-pay | Admitting: *Deleted

## 2016-09-21 ENCOUNTER — Ambulatory Visit
Admission: RE | Admit: 2016-09-21 | Discharge: 2016-09-21 | Disposition: A | Discharge status: Home / Self Care | Payer: BLUE CROSS/BLUE SHIELD | Source: Ambulatory Visit | Attending: Oncology | Admitting: Oncology

## 2016-09-21 ENCOUNTER — Encounter (INDEPENDENT_AMBULATORY_CARE_PROVIDER_SITE_OTHER): Payer: Self-pay

## 2016-09-21 DIAGNOSIS — C50211 Malignant neoplasm of upper-inner quadrant of right female breast: Secondary | ICD-10-CM

## 2016-09-21 DIAGNOSIS — Z170 Estrogen receptor positive status [ER+]: Principal | ICD-10-CM

## 2016-09-21 HISTORY — DX: Personal history of irradiation: Z92.3

## 2016-10-24 ENCOUNTER — Telehealth: Payer: Self-pay | Admitting: Oncology

## 2016-10-24 ENCOUNTER — Encounter: Payer: Self-pay | Admitting: Oncology

## 2016-10-24 NOTE — Telephone Encounter (Signed)
Left message for patient regarding appointment 12/13.

## 2016-10-24 NOTE — Telephone Encounter (Signed)
Erroneous encounter

## 2017-02-14 ENCOUNTER — Ambulatory Visit: Payer: Self-pay | Admitting: Oncology

## 2017-02-14 ENCOUNTER — Other Ambulatory Visit: Payer: Self-pay

## 2017-02-14 ENCOUNTER — Other Ambulatory Visit: Payer: BLUE CROSS/BLUE SHIELD

## 2017-03-26 ENCOUNTER — Inpatient Hospital Stay (HOSPITAL_BASED_OUTPATIENT_CLINIC_OR_DEPARTMENT_OTHER): Payer: BLUE CROSS/BLUE SHIELD | Admitting: Oncology

## 2017-03-26 ENCOUNTER — Inpatient Hospital Stay: Payer: BLUE CROSS/BLUE SHIELD | Attending: Oncology

## 2017-03-26 VITALS — BP 128/64 | HR 77 | Temp 98.6°F | Resp 18 | Ht 59.0 in | Wt 137.0 lb

## 2017-03-26 DIAGNOSIS — Z79810 Long term (current) use of selective estrogen receptor modulators (SERMs): Secondary | ICD-10-CM | POA: Diagnosis not present

## 2017-03-26 DIAGNOSIS — K219 Gastro-esophageal reflux disease without esophagitis: Secondary | ICD-10-CM | POA: Diagnosis not present

## 2017-03-26 DIAGNOSIS — Z9221 Personal history of antineoplastic chemotherapy: Secondary | ICD-10-CM

## 2017-03-26 DIAGNOSIS — C50211 Malignant neoplasm of upper-inner quadrant of right female breast: Secondary | ICD-10-CM

## 2017-03-26 DIAGNOSIS — Z9011 Acquired absence of right breast and nipple: Secondary | ICD-10-CM | POA: Diagnosis not present

## 2017-03-26 DIAGNOSIS — R232 Flushing: Secondary | ICD-10-CM

## 2017-03-26 DIAGNOSIS — Z79899 Other long term (current) drug therapy: Secondary | ICD-10-CM | POA: Insufficient documentation

## 2017-03-26 DIAGNOSIS — E785 Hyperlipidemia, unspecified: Secondary | ICD-10-CM | POA: Insufficient documentation

## 2017-03-26 DIAGNOSIS — G4700 Insomnia, unspecified: Secondary | ICD-10-CM | POA: Insufficient documentation

## 2017-03-26 DIAGNOSIS — Z803 Family history of malignant neoplasm of breast: Secondary | ICD-10-CM | POA: Insufficient documentation

## 2017-03-26 DIAGNOSIS — Z170 Estrogen receptor positive status [ER+]: Secondary | ICD-10-CM

## 2017-03-26 DIAGNOSIS — Z923 Personal history of irradiation: Secondary | ICD-10-CM | POA: Diagnosis not present

## 2017-03-26 LAB — COMPREHENSIVE METABOLIC PANEL
ALT: 14 U/L (ref 0–55)
AST: 17 U/L (ref 5–34)
Albumin: 3.9 g/dL (ref 3.5–5.0)
Alkaline Phosphatase: 48 U/L (ref 40–150)
Anion gap: 9 (ref 3–11)
BILIRUBIN TOTAL: 0.3 mg/dL (ref 0.2–1.2)
BUN: 14 mg/dL (ref 7–26)
CALCIUM: 9.3 mg/dL (ref 8.4–10.4)
CO2: 28 mmol/L (ref 22–29)
CREATININE: 0.73 mg/dL (ref 0.60–1.10)
Chloride: 104 mmol/L (ref 98–109)
Glucose, Bld: 91 mg/dL (ref 70–140)
Potassium: 3.7 mmol/L (ref 3.3–4.7)
Sodium: 141 mmol/L (ref 136–145)
TOTAL PROTEIN: 7.6 g/dL (ref 6.4–8.3)

## 2017-03-26 LAB — CBC WITH DIFFERENTIAL/PLATELET
Basophils Absolute: 0 10*3/uL (ref 0.0–0.1)
Basophils Relative: 1 %
EOS PCT: 4 %
Eosinophils Absolute: 0.2 10*3/uL (ref 0.0–0.5)
HEMATOCRIT: 38.2 % (ref 34.8–46.6)
Hemoglobin: 13 g/dL (ref 11.6–15.9)
LYMPHS ABS: 1.9 10*3/uL (ref 0.9–3.3)
LYMPHS PCT: 44 %
MCH: 30.9 pg (ref 25.1–34.0)
MCHC: 34 g/dL (ref 31.5–36.0)
MCV: 90.7 fL (ref 79.5–101.0)
MONO ABS: 0.4 10*3/uL (ref 0.1–0.9)
Monocytes Relative: 9 %
Neutro Abs: 1.8 10*3/uL (ref 1.5–6.5)
Neutrophils Relative %: 42 %
PLATELETS: 251 10*3/uL (ref 145–400)
RBC: 4.21 MIL/uL (ref 3.70–5.45)
RDW: 12.2 % (ref 11.2–16.1)
WBC: 4.2 10*3/uL (ref 3.9–10.3)
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
## 2017-06-20 IMAGING — MG MM SCREEN MAMMOGRAM BILATERAL
5 series · 5 of 5 positions shown · non-contrast
Comparison: Previous exam(s).

CLINICAL DATA: Screening.

EXAM:
DIGITAL SCREENING BILATERAL MAMMOGRAM WITH CAD

[R CC (1 of 2)]
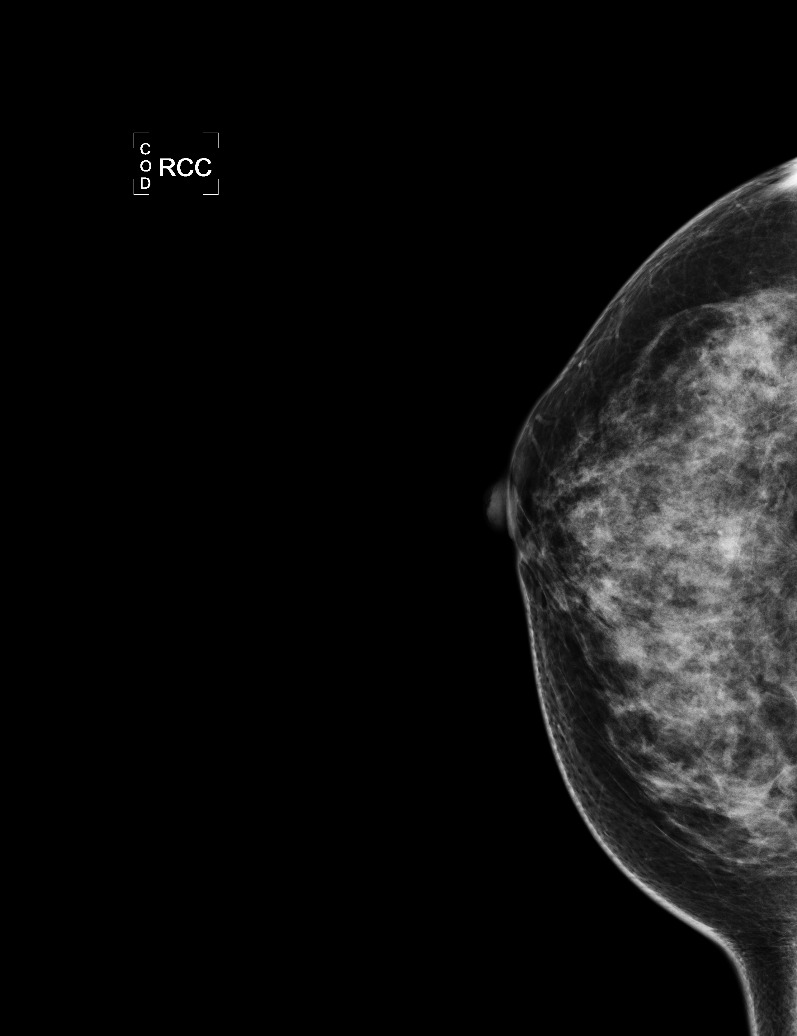

[L CC]
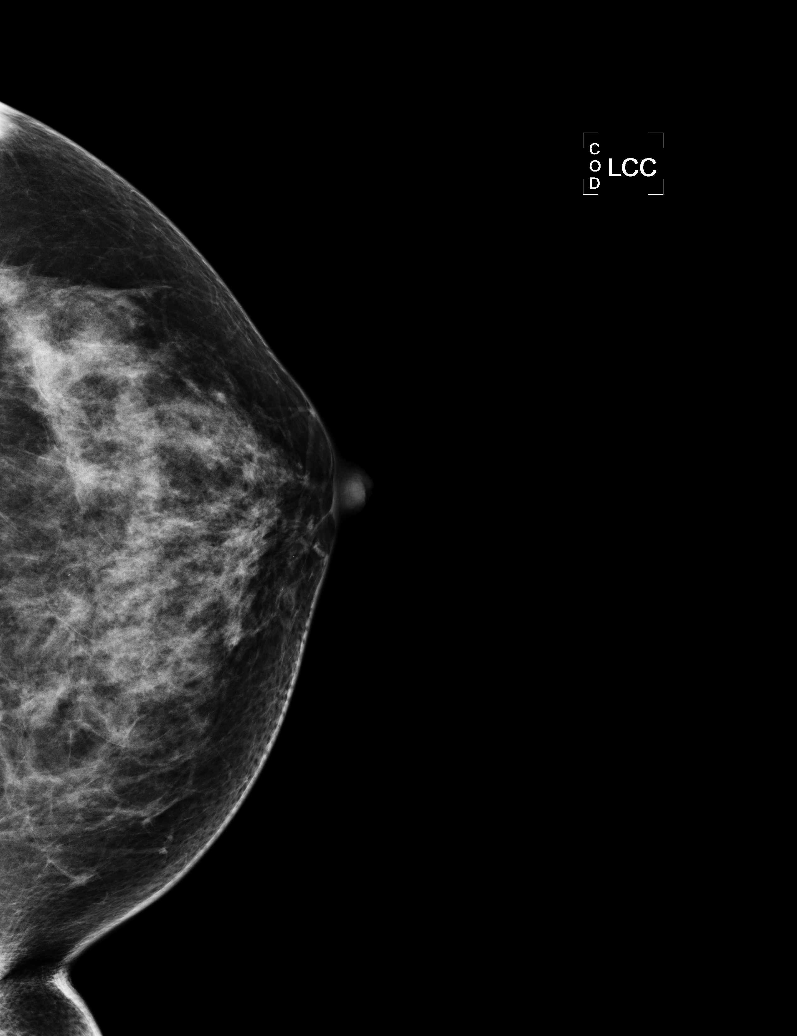

[L MLO]
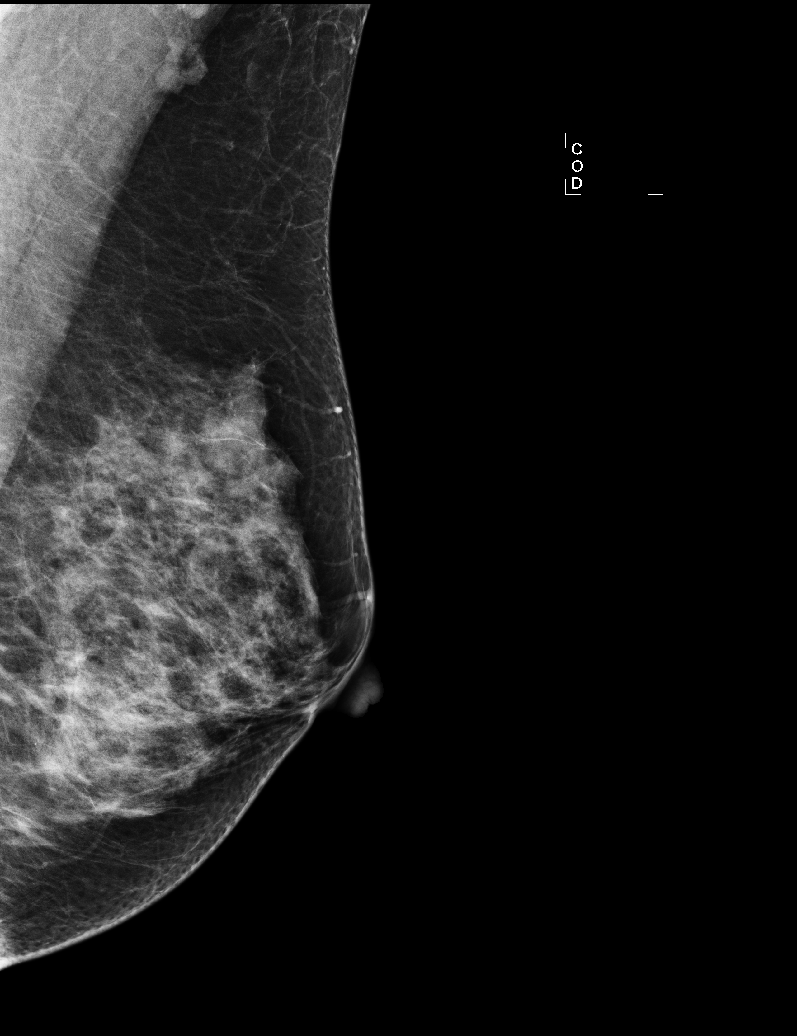

[R MLO]
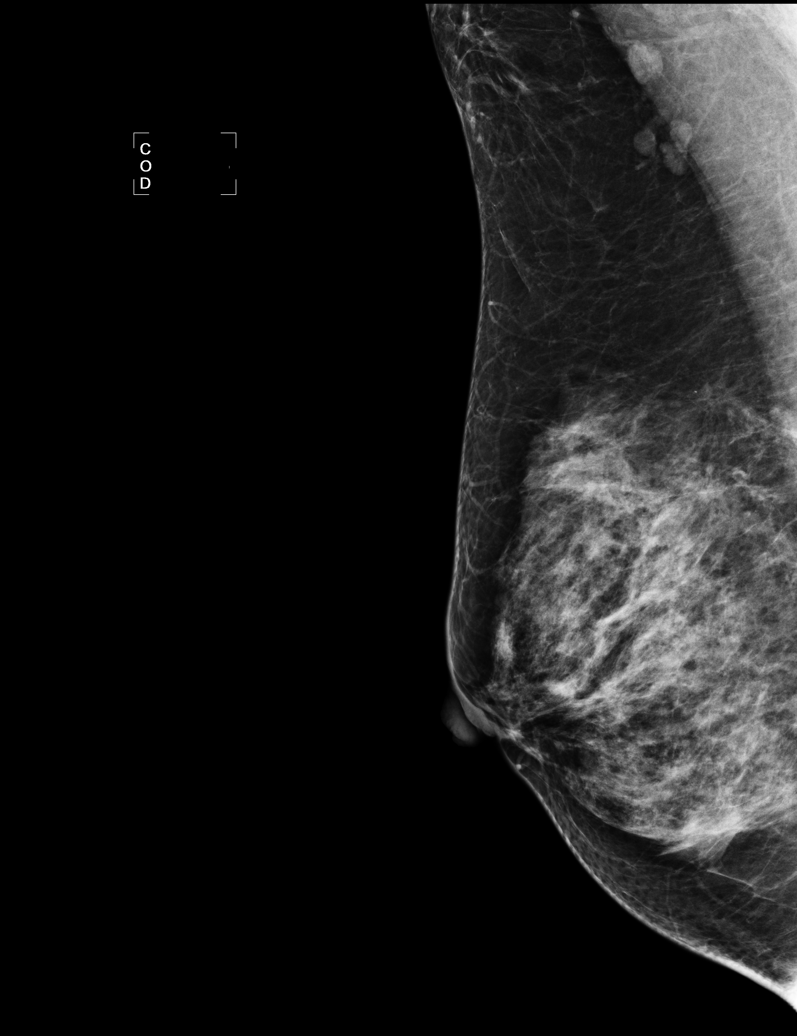

[R CC (2 of 2)]
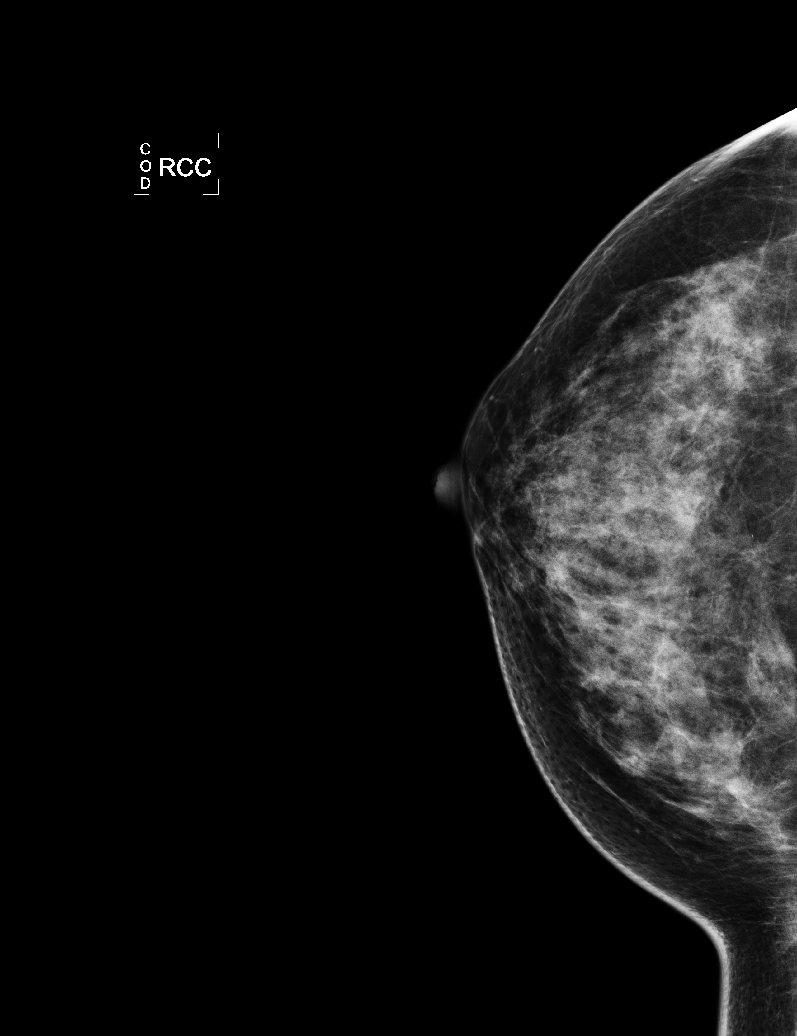

[5 of 5 positions shown; findings below may reference images not displayed]

ACR Breast Density Category c: The breast tissue is heterogeneously
dense, which may obscure small masses.
FINDINGS: In the right breast, a possible asymmetry warrants further
evaluation. In the left breast, no findings suspicious for
malignancy. Images were processed with CAD.
IMPRESSION: Further evaluation is suggested for possible asymmetry in the right
breast.

RECOMMENDATION:
Diagnostic mammogram and possibly ultrasound of the right breast.
(Code:[id number]-I-77H)

The patient will be contacted regarding the findings, and additional
imaging will be scheduled.

BI-RADS CATEGORY  0: Incomplete. Need additional imaging evaluation
and/or prior mammograms for comparison.

## 2017-06-24 ENCOUNTER — Other Ambulatory Visit: Payer: Self-pay | Admitting: Oncology

## 2017-11-25 ENCOUNTER — Other Ambulatory Visit: Payer: Self-pay | Admitting: Oncology

## 2017-11-26 ENCOUNTER — Other Ambulatory Visit: Payer: Self-pay | Admitting: Oncology

## 2017-11-26 DIAGNOSIS — Z9889 Other specified postprocedural states: Secondary | ICD-10-CM

## 2017-11-29 ENCOUNTER — Other Ambulatory Visit: Payer: Self-pay | Admitting: Hematology

## 2017-11-29 ENCOUNTER — Other Ambulatory Visit: Payer: Self-pay | Admitting: Adult Health

## 2017-11-29 DIAGNOSIS — Z9889 Other specified postprocedural states: Secondary | ICD-10-CM

## 2017-12-06 ENCOUNTER — Inpatient Hospital Stay: Admission: RE | Admit: 2017-12-06 | Payer: BLUE CROSS/BLUE SHIELD | Source: Ambulatory Visit

## 2017-12-11 ENCOUNTER — Ambulatory Visit
Admission: RE | Admit: 2017-12-11 | Discharge: 2017-12-11 | Disposition: A | Discharge status: Home / Self Care | Payer: BLUE CROSS/BLUE SHIELD | Source: Ambulatory Visit | Attending: Adult Health | Admitting: Adult Health

## 2017-12-11 DIAGNOSIS — Z9889 Other specified postprocedural states: Secondary | ICD-10-CM

## 2018-03-26 NOTE — Progress Notes (Addendum)
No show

## 2018-03-27 ENCOUNTER — Inpatient Hospital Stay: Payer: BLUE CROSS/BLUE SHIELD | Attending: Oncology | Admitting: Oncology

## 2018-03-27 ENCOUNTER — Encounter: Payer: Self-pay | Admitting: Oncology

## 2018-03-27 ENCOUNTER — Inpatient Hospital Stay: Payer: BLUE CROSS/BLUE SHIELD

## 2018-06-18 ENCOUNTER — Other Ambulatory Visit: Payer: Self-pay | Admitting: Oncology

## 2018-06-25 ENCOUNTER — Other Ambulatory Visit: Payer: Self-pay | Admitting: Oncology

## 2018-11-21 ENCOUNTER — Other Ambulatory Visit: Payer: Self-pay | Admitting: Oncology

## 2019-04-22 ENCOUNTER — Other Ambulatory Visit: Payer: Self-pay | Admitting: Oncology

## 2019-04-22 DIAGNOSIS — Z9889 Other specified postprocedural states: Secondary | ICD-10-CM

## 2019-05-18 ENCOUNTER — Other Ambulatory Visit: Payer: Self-pay

## 2019-05-18 ENCOUNTER — Ambulatory Visit
Admission: RE | Admit: 2019-05-18 | Discharge: 2019-05-18 | Disposition: A | Discharge status: Home / Self Care | Payer: 59 | Source: Ambulatory Visit | Attending: Oncology | Admitting: Oncology

## 2019-05-18 DIAGNOSIS — Z9889 Other specified postprocedural states: Secondary | ICD-10-CM

## 2019-06-24 ENCOUNTER — Telehealth: Payer: Self-pay | Admitting: Oncology

## 2019-06-24 NOTE — Telephone Encounter (Signed)
Scheduled appt per 4/21 sch message - unable to reach pt . Left message with appt date and time   

## 2019-07-01 ENCOUNTER — Other Ambulatory Visit: Payer: Self-pay

## 2019-07-01 DIAGNOSIS — C50211 Malignant neoplasm of upper-inner quadrant of right female breast: Secondary | ICD-10-CM

## 2019-07-01 DIAGNOSIS — Z170 Estrogen receptor positive status [ER+]: Secondary | ICD-10-CM

## 2019-07-02 ENCOUNTER — Inpatient Hospital Stay: Payer: 59

## 2019-07-02 ENCOUNTER — Inpatient Hospital Stay: Payer: 59 | Attending: Oncology | Admitting: Oncology

## 2019-07-17 ENCOUNTER — Other Ambulatory Visit: Payer: Self-pay | Admitting: *Deleted

## 2019-07-18 ENCOUNTER — Other Ambulatory Visit: Payer: Self-pay | Admitting: Oncology

## 2019-07-21 ENCOUNTER — Telehealth: Payer: Self-pay | Admitting: Oncology

## 2019-07-21 NOTE — Telephone Encounter (Signed)
Scheduled per 5/17 sch message. Unable to reach pt. Left voicemail- appts on 8/10.

## 2019-10-12 NOTE — Progress Notes (Signed)
[facility]  [phone] [phone]     ID: Taylor Zamora DOB: 03/13/1965  MR#: [id number]  CSN#:[id number]  Patient Care Team: Fulp, Cammie, MD as PCP - General (Family Medicine) Newman, David, MD as Consulting Physician (General Surgery) Magrinat, Gustav C, MD as Consulting Physician (Oncology) Mackey, Heather Thompson, NP as Nurse Practitioner (Hematology and Oncology) Tomblin, James, MD as Consulting Physician (Obstetrics and Gynecology) Kinard, James, MD as Consulting Physician (Radiation Oncology) OTHER MD:  CHIEF COMPLAINT: Estrogen receptor positive breast cancer  CURRENT TREATMENT: Tamoxifen   INTERVAL HISTORY: Taylor Zamora returns today for a follow-up of her estrogen receptor positive breast cancer.    She continues on tamoxifen.  She generally tolerates this well, with only occasional hot flashes.  She actually ran out about 3 weeks ago and did not notice any change while off the medication  Since her last visit, she underwent bilateral diagnostic mammography with tomography at [facility] on 05/18/2019 showing: breast density category C; no evidence of malignancy in either breast.   REVIEW OF SYSTEMS: Taylor Zamora tells me that she becomes flustered with her children and stressed with other things and then she gets palpitations.  She says this has been going on more than 20 years.  She her daughter and granddaughter all had Covid January 2021.  Her own symptoms were mild, with some loss of sense of smell and some cough and shortness of breath.  That has largely resolved.  She works in the garden and has not gone back to the gym yet but does not note any restrictions in her activities.  A detailed review of systems today was otherwise stable   BREAST CANCER HISTORY: From the original intake note:  "Taylor Zamora" [facility]. This suggested a possible asymmetry in the right breast. On  02/23/2015 the patient underwent right diagnostic mammography with tomosynthesis and right breast ultrasonography. The breast density was category C. In the upper right breast there was an area of distortion which was palpable at approximately 12:30 o'clock. Ultrasound found an ill-defined mass measuring 8 mm. Ultrasound of the right axilla was benign.  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  Her subsequent history is as detailed below   PAST MEDICAL HISTORY: Past Medical History:  Diagnosis Date  . Allergic rhinitis    hay fever  . Breast cancer (HCC)   . Breast cancer of upper-inner quadrant of right female breast (HCC) 05/03/2015  . Food allergy    carrots, celery, kiwi, apple, peanuts  . GERD (gastroesophageal reflux disease)   . Headache(784.0)   . Hyperlipidemia   . Personal history of radiation therapy 2017    PAST SURGICAL HISTORY: Past Surgical History:  Procedure Laterality Date  . BREAST LUMPECTOMY Right 06/15/2015  . CESAREAN SECTION  2000,2004   x 2  . RADIOACTIVE SEED GUIDED PARTIAL MASTECTOMY WITH AXILLARY SENTINEL LYMPH NODE BIOPSY Right 06/27/2015   Procedure: RADIOACTIVE SEED GUIDED PARTIAL MASTECTOMY WITH AXILLARY SENTINEL LYMPH NODE BIOPSY;  Surgeon: David Newman, MD;  Location:  SURGERY CENTER;  Service: General;  Laterality: Right;  . RADIOACTIVE SEED GUIDED PARTIAL MASTECTOMY/AXILLARY SENTINEL NODE BIOPSY/AXILLARY NODE DISSECTION Right 06/27/2015   Procedure: RADIOACTIVE SEED GUIDED PARTIAL MASTECTOMY   WITH AXILLARY SENTINEL LYMPH NODE BIOPSY AND AXILLARY LYMPH NODE DISSECTION;  Surgeon: David Newman, MD;  Location: [facility];  Service: General;   Laterality: Right;    FAMILY HISTORY Family History  Problem Relation Age of Onset  . Diabetes Mother   . Hyperlipidemia Mother   . Breast cancer Cousin   As of March 2017 the patient's parents are still living, her father age 80 and her mother age 78. The patient has 2 brothers and 2 sisters. One maternal cousin was diagnosed with breast cancer in her late 60s.   GYNECOLOGIC HISTORY:  Patient's last menstrual period was 11/25/2017. Menarche age 14, first live birth age 33, which the patient understands increases the risk of breast cancer. She is GX P2. She stopped having periods in December 2016 and used hormone replacement for approximately 2 months. She used oral contraceptives remotely for approximately 10 years.   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 She works at the business with her husband. The patient is originally from [place]. Her husband [name] is originally from [place]. Their daughter [name] and son [name] are currently 19 and 14, respectively. The daughter started at [employer] in the fall of 2018 but is currently at home. The son has a great interest in [employer] an engineering and is getting always at school    ADVANCED DIRECTIVES: Not in place   HEALTH MAINTENANCE: Social History   Tobacco Use  . Smoking status: Never Smoker  . Smokeless tobacco: Never Used  Substance Use Topics  . Alcohol use: No  . Drug use: No     Colonoscopy:Never  PAP:  Bone density: Remote  Lipid panel:  No Known Allergies  Current Outpatient Medications  Medication Sig Dispense Refill  . Cyanocobalamin (VITAMIN B 12 PO) Take 1 tablet by mouth daily.    . gabapentin (NEURONTIN) 300 MG capsule Take 1 capsule (300 mg total) by mouth at bedtime. 90 capsule 4  . Omega 3 1000 MG CAPS Take by mouth. Reported on 09/20/2015    . tamoxifen (NOLVADEX) 20 MG tablet TAKE 1 TABLET(20 MG) BY MOUTH DAILY 90  tablet 0  . Vitamin D, Cholecalciferol, 1000 units CAPS Take by mouth.     No current facility-administered medications for this visit.    OBJECTIVE: Filipino woman who appears younger than stated age   Vitals:   10/13/19 1519  BP: 128/78  Pulse: 67  Resp: 16  Temp: 98.6 F (37 C)  SpO2: 100%     Body mass index is 26.32 kg/m.   ECOG FS:1 - Symptomatic but completely ambulatory  Sclerae unicteric, EOMs intact Wearing a mask No cervical or supraclavicular adenopathy Lungs no rales or rhonchi Heart regular rate and rhythm with occasional premature beats, 1/6 irregular murmur Abd soft, nontender, positive bowel sounds MSK no focal spinal tenderness, no upper extremity lymphedema Neuro: nonfocal, well oriented, appropriate affect Breasts: The right breast is status post lumpectomy and radiation with no evidence of disease recurrence.  There is still some hyperpigmentation.  The left breast is benign.  Both axillae are benign.   LAB RESULTS:  CMP     Component Value Date/Time   NA 139 10/13/2019 1446   NA 144 08/15/2016 1008   K 4.1 10/13/2019 1446   K 3.9 08/15/2016 1008   CL 106 10/13/2019 1446   CO2 25 10/13/2019 1446   CO2 25 08/15/2016 1008   GLUCOSE   118 (H) 10/13/2019 1446   GLUCOSE 99 08/15/2016 1008   BUN 18 10/13/2019 1446   BUN 18.0 08/15/2016 1008   CREATININE 0.79 10/13/2019 1446   CREATININE 0.8 08/15/2016 1008   CALCIUM 9.5 10/13/2019 1446   CALCIUM 9.0 08/15/2016 1008   PROT 7.7 10/13/2019 1446   PROT 7.0 08/15/2016 1008   ALBUMIN 4.0 10/13/2019 1446   ALBUMIN 3.7 08/15/2016 1008   AST 23 10/13/2019 1446   AST 18 08/15/2016 1008   ALT 14 10/13/2019 1446   ALT 10 08/15/2016 1008   ALKPHOS 55 10/13/2019 1446   ALKPHOS 34 (L) 08/15/2016 1008   BILITOT 0.3 10/13/2019 1446   BILITOT 0.48 08/15/2016 1008   GFRNONAA >60 10/13/2019 1446   GFRAA >60 10/13/2019 1446    INo results found for: SPEP, UPEP  Lab Results  Component Value Date   WBC  5.8 10/13/2019   NEUTROABS 2.6 10/13/2019   HGB 12.7 10/13/2019   HCT 37.9 10/13/2019   MCV 91.3 10/13/2019   PLT 268 10/13/2019      Chemistry      Component Value Date/Time   NA 139 10/13/2019 1446   NA 144 08/15/2016 1008   K 4.1 10/13/2019 1446   K 3.9 08/15/2016 1008   CL 106 10/13/2019 1446   CO2 25 10/13/2019 1446   CO2 25 08/15/2016 1008   BUN 18 10/13/2019 1446   BUN 18.0 08/15/2016 1008   CREATININE 0.79 10/13/2019 1446   CREATININE 0.8 08/15/2016 1008      Component Value Date/Time   CALCIUM 9.5 10/13/2019 1446   CALCIUM 9.0 08/15/2016 1008   ALKPHOS 55 10/13/2019 1446   ALKPHOS 34 (L) 08/15/2016 1008   AST 23 10/13/2019 1446   AST 18 08/15/2016 1008   ALT 14 10/13/2019 1446   ALT 10 08/15/2016 1008   BILITOT 0.3 10/13/2019 1446   BILITOT 0.48 08/15/2016 1008       No results found for: LABCA2  No components found for: LABCA125  No results for input(s): INR in the last 168 hours.  Urinalysis    Component Value Date/Time   COLORURINE YELLOW 01/02/2007 1436   APPEARANCEUR Clear 01/02/2007 1436   LABSPEC > OR = 1.030 01/02/2007 1436   PHURINE 5.5 01/02/2007 1436   GLUCOSEU NEGATIVE 01/02/2007 1436   BILIRUBINUR NEGATIVE 01/02/2007 1436   KETONESUR NEGATIVE 01/02/2007 1436   UROBILINOGEN 0.2 mg/dL 01/02/2007 1436   NITRITE Negative 01/02/2007 1436   LEUKOCYTESUR Negative 01/02/2007 1436   ELIGIBLE FOR AVAILABLE RESEARCH PROTOCOL: no   STUDIES: No results found.   ASSESSMENT: 53 y.o. [place] woman status post right breast upper inner quadrant biopsy 04/29/2015 for a clinical T1b N0, stage IA  invasive ductal carcinoma, grade 2, estrogen and progesterone receptor positive, HER-2 nonamplified, with an MIB-1 of 5%.  (1) status post right lumpectomy and sentinel lymph node sampling 06/27/2015 for a pT2 pN0, stage IIA invasive lobular carcinoma, grade 1, with negative margins, again HER-2 negative  (2) Oncotype DX score of 10 predicts an  outside the breast risk of recurrence within 10 years of 7% if the patient's only systemic treatment is tamoxifen for 5 years. It also predicts no significant benefit from chemotherapy  (3) adjuvant radiation 08/11/2015-09/30/2015:  (1) Right Breast: 50.4 Gy in 28 fractions.              (2) Right Breast Boost: 10 Gy in 5 fractions.   (4) tamoxifen started 11/25/2015   PLAN: Vongie is now 4-1/2   years out from definitive surgery for her breast cancer with no evidence of disease recurrence.  This is very favorable.  She is tolerating tamoxifen well and the plan is to continue that an additional year after which she will be able to "graduate" from follow-up.  I encouraged her to increase her exercise tolerance.  I reassured her the most premature beats are benign and offered to get her an electrocardiogram today or Cardiologic referral but she says this is a very old problem or concern and preferred not to pursue this  She knows to call for any other issue that may develop before the next visit.  Total encounter time 25 minutes.*   Aubriegh Minch, Virgie Dad, MD  10/13/19 5:02 PM Medical Oncology and Hematology [facility] [place], [place] [address] Tel. [phone]    Fax. [phone]   I, Wilburn Mylar, am acting as scribe for Dr. Virgie Dad. Loranzo Desha.  I, Lurline Del MD, have reviewed the above documentation for accuracy and completeness, and I agree with the above.   *Total Encounter Time as defined by the Centers for Medicare and Medicaid Services includes, in addition to the face-to-face time of a patient visit (documented in the note above) non-face-to-face time: obtaining and reviewing outside history, ordering and reviewing medications, tests or procedures, care coordination (communications with other health care professionals or caregivers) and documentation in the medical record.

## 2019-10-13 ENCOUNTER — Other Ambulatory Visit: Payer: Self-pay

## 2019-10-13 ENCOUNTER — Other Ambulatory Visit: Payer: Self-pay | Admitting: Oncology

## 2019-10-13 ENCOUNTER — Telehealth: Payer: Self-pay | Admitting: Oncology

## 2019-10-13 ENCOUNTER — Inpatient Hospital Stay: Payer: 59 | Attending: Oncology | Admitting: Oncology

## 2019-10-13 ENCOUNTER — Inpatient Hospital Stay: Payer: 59

## 2019-10-13 VITALS — BP 128/78 | HR 67 | Temp 98.6°F | Resp 16 | Ht 59.0 in | Wt 130.3 lb

## 2019-10-13 DIAGNOSIS — Z79810 Long term (current) use of selective estrogen receptor modulators (SERMs): Secondary | ICD-10-CM | POA: Insufficient documentation

## 2019-10-13 DIAGNOSIS — Z170 Estrogen receptor positive status [ER+]: Secondary | ICD-10-CM | POA: Insufficient documentation

## 2019-10-13 DIAGNOSIS — R232 Flushing: Secondary | ICD-10-CM | POA: Diagnosis not present

## 2019-10-13 DIAGNOSIS — K219 Gastro-esophageal reflux disease without esophagitis: Secondary | ICD-10-CM | POA: Insufficient documentation

## 2019-10-13 DIAGNOSIS — Z923 Personal history of irradiation: Secondary | ICD-10-CM | POA: Insufficient documentation

## 2019-10-13 DIAGNOSIS — E785 Hyperlipidemia, unspecified: Secondary | ICD-10-CM | POA: Diagnosis not present

## 2019-10-13 DIAGNOSIS — E119 Type 2 diabetes mellitus without complications: Secondary | ICD-10-CM | POA: Insufficient documentation

## 2019-10-13 DIAGNOSIS — C50211 Malignant neoplasm of upper-inner quadrant of right female breast: Secondary | ICD-10-CM | POA: Insufficient documentation

## 2019-10-13 LAB — CBC WITH DIFFERENTIAL (CANCER CENTER ONLY)
Abs Immature Granulocytes: 0.01 10*3/uL (ref 0.00–0.07)
Basophils Absolute: 0.1 10*3/uL (ref 0.0–0.1)
Basophils Relative: 1 %
Eosinophils Absolute: 0.3 10*3/uL (ref 0.0–0.5)
Eosinophils Relative: 5 %
HCT: 37.9 % (ref 36.0–46.0)
Hemoglobin: 12.7 g/dL (ref 12.0–15.0)
Immature Granulocytes: 0 %
Lymphocytes Relative: 42 %
Lymphs Abs: 2.5 10*3/uL (ref 0.7–4.0)
MCH: 30.6 pg (ref 26.0–34.0)
MCHC: 33.5 g/dL (ref 30.0–36.0)
MCV: 91.3 fL (ref 80.0–100.0)
Monocytes Absolute: 0.4 10*3/uL (ref 0.1–1.0)
Monocytes Relative: 7 %
Neutro Abs: 2.6 10*3/uL (ref 1.7–7.7)
Neutrophils Relative %: 45 %
Platelet Count: 268 10*3/uL (ref 150–400)
RBC: 4.15 MIL/uL (ref 3.87–5.11)
RDW: 12.4 % (ref 11.5–15.5)
WBC Count: 5.8 10*3/uL (ref 4.0–10.5)
nRBC: 0 % (ref 0.0–0.2)

## 2019-10-13 LAB — CMP (CANCER CENTER ONLY)
ALT: 14 U/L (ref 0–44)
AST: 23 U/L (ref 15–41)
Albumin: 4 g/dL (ref 3.5–5.0)
Alkaline Phosphatase: 55 U/L (ref 38–126)
Anion gap: 8 (ref 5–15)
BUN: 18 mg/dL (ref 6–20)
CO2: 25 mmol/L (ref 22–32)
Calcium: 9.5 mg/dL (ref 8.9–10.3)
Chloride: 106 mmol/L (ref 98–111)
Creatinine: 0.79 mg/dL (ref 0.44–1.00)
GFR, Est AFR Am: >60 mL/min (ref >60)
GFR, Estimated: >60 mL/min (ref >60)
Glucose, Bld: 118 mg/dL — ABNORMAL HIGH (ref 70–99)
Potassium: 4.1 mmol/L (ref 3.5–5.1)
Sodium: 139 mmol/L (ref 135–145)
Total Bilirubin: 0.3 mg/dL (ref 0.3–1.2)
Total Protein: 7.7 g/dL (ref 6.5–8.1)

## 2019-10-13 NOTE — Telephone Encounter (Signed)
Scheduled appts per 8/10 los. Gave pt a print out of AVS.  

## 2020-03-26 ENCOUNTER — Other Ambulatory Visit: Payer: Self-pay | Admitting: Oncology

## 2020-03-30 ENCOUNTER — Other Ambulatory Visit: Payer: Self-pay | Admitting: *Deleted

## 2020-03-30 MED ORDER — TAMOXIFEN CITRATE 20 MG PO TABS: ORAL_TABLET | ORAL | 0 refills | Status: DC

## 2020-06-30 ENCOUNTER — Other Ambulatory Visit: Payer: Self-pay | Admitting: Oncology

## 2020-06-30 ENCOUNTER — Ambulatory Visit (INDEPENDENT_AMBULATORY_CARE_PROVIDER_SITE_OTHER): Payer: 59 | Admitting: Nurse Practitioner

## 2020-06-30 VITALS — BP 136/76 | HR 71 | Temp 97.9°F | Resp 18

## 2020-06-30 DIAGNOSIS — Z8616 Personal history of COVID-19: Secondary | ICD-10-CM | POA: Diagnosis not present

## 2020-06-30 DIAGNOSIS — Z1231 Encounter for screening mammogram for malignant neoplasm of breast: Secondary | ICD-10-CM

## 2020-06-30 MED ORDER — FAMOTIDINE 20 MG PO TABS: 20.0000 mg | ORAL_TABLET | Freq: Two times a day (BID) | ORAL | 0 refills | Status: DC

## 2020-06-30 MED ORDER — MONTELUKAST SODIUM 10 MG PO TABS: 10.0000 mg | ORAL_TABLET | Freq: Every day | ORAL | 3 refills | Status: AC

## 2020-06-30 NOTE — Patient Instructions (Addendum)
History of Covid 19 Wheezing Shortness:   Stay well hydrated  Stay active  Deep breathing exercises  May take tylenol or fever or pain   Will order chest x ray:  [facility] [address], [place] [address] [phone] MON - FRI 8:00 AM - 4:00 PM - WALK IN  Will order Singulair  Will order Pepcid   Hand out given on home PT   Follow up:  Follow up in 2 weeks or sooner if needed

## 2020-06-30 NOTE — Progress Notes (Signed)
@Patient  ID: Taylor Zamora, female    DOB: February 18, 1966, 55 y.o.   MRN: [id number]  Chief Complaint  Patient presents with  . history of covid    Shortness of breath, wheezing    Referring provider: No ref. provider found  HPI  Patient presents today for post-COVID care clinic visit.  Patient states that she tested positive for COVID in January 2021.  She was sick again in February 2022 and thinks that she had COVID but was not tested at that time.  She has been fully vaccinated.  She states that she does have ongoing symptoms of wheezing at times and shortness of breath with exertion.  Patient was walked in office today and her O2 sats and heart rate remained stable throughout her walk.  Patient has not seen her primary care for these issues and has not had any imaging or prescriptions. Denies f/c/s, n/v/d, hemoptysis, PND, chest pain or edema.     No Known Allergies   There is no immunization history on file for this patient.  Past Medical History:  Diagnosis Date  . Allergic rhinitis    hay fever  . Breast cancer ([place])   . Breast cancer of upper-inner quadrant of right female breast ([place]) 05/03/2015  . Food allergy    carrots, celery, kiwi, apple, peanuts  . GERD (gastroesophageal reflux disease)   . Headache(784.0)   . Hyperlipidemia   . Personal history of radiation therapy 2017    Tobacco History: Social History   Tobacco Use  Smoking Status Never Smoker  Smokeless Tobacco Never Used   Counseling given: Yes   Outpatient Encounter Medications as of 06/30/2020  Medication Sig  . famotidine (PEPCID) 20 MG tablet Take 1 tablet (20 mg total) by mouth 2 (two) times daily.  . montelukast (SINGULAIR) 10 MG tablet Take 1 tablet (10 mg total) by mouth at bedtime.  . Cyanocobalamin (VITAMIN B 12 PO) Take 1 tablet by mouth daily.  Marland Kitchen gabapentin (NEURONTIN) 300 MG capsule Take 1 capsule (300 mg total) by mouth at bedtime.  . Omega 3 1000 MG CAPS Take by mouth. Reported on  09/20/2015  . tamoxifen (NOLVADEX) 20 MG tablet TAKE 1 TABLET(20 MG) BY MOUTH DAILY  . Vitamin D, Cholecalciferol, 1000 units CAPS Take by mouth.   No facility-administered encounter medications on file as of 06/30/2020.     Review of Systems  Review of Systems  Constitutional: Negative.  Negative for fever.  HENT: Negative.   Respiratory: Positive for shortness of breath and wheezing. Negative for cough.   Cardiovascular: Negative.  Negative for chest pain, palpitations and leg swelling.  Gastrointestinal: Negative.   Allergic/Immunologic: Negative.   Neurological: Negative.   Psychiatric/Behavioral: Negative.        Physical Exam  BP 136/76   Pulse 71   Temp 97.9 F (36.6 C)   Resp 18   LMP 11/25/2017   SpO2 99%   Wt Readings from Last 5 Encounters:  10/13/19 130 lb 4.8 oz (59.1 kg)  03/26/17 137 lb (62.1 kg)  08/15/16 128 lb 3.2 oz (58.2 kg)  02/02/16 129 lb 8 oz (58.7 kg)  11/25/15 133 lb 4.8 oz (60.5 kg)     Physical Exam Vitals and nursing note reviewed.  Constitutional:      General: She is not in acute distress.    Appearance: She is well-developed.  Cardiovascular:     Rate and Rhythm: Normal rate and regular rhythm.  Pulmonary:     Effort: Pulmonary  effort is normal.     Breath sounds: Normal breath sounds.  Musculoskeletal:     Right lower leg: No edema.     Left lower leg: No edema.  Neurological:     Mental Status: She is alert and oriented to person, place, and time.  Psychiatric:        Mood and Affect: Mood normal.        Behavior: Behavior normal.        Assessment & Plan:   History of COVID-19 Wheezing Shortness:   Stay well hydrated  Stay active  Deep breathing exercises  May take tylenol or fever or pain   Will order chest x ray:  [facility] [address], [place] [address] [phone] MON - FRI 8:00 AM - 4:00 PM - WALK IN  Will order Singulair  Will order Pepcid   Hand out given on home  PT   Follow up:  Follow up in 2 weeks or sooner if needed      Fenton Foy, NP 07/01/2020

## 2020-07-01 DIAGNOSIS — Z8616 Personal history of COVID-19: Secondary | ICD-10-CM | POA: Insufficient documentation

## 2020-07-01 NOTE — Assessment & Plan Note (Signed)
Wheezing Shortness:   Stay well hydrated  Stay active  Deep breathing exercises  May take tylenol or fever or pain   Will order chest x ray:  [facility] [address], [place] [address] [phone] MON - FRI 8:00 AM - 4:00 PM - WALK IN  Will order Singulair  Will order Pepcid   Hand out given on home PT   Follow up:  Follow up in 2 weeks or sooner if needed

## 2020-07-14 ENCOUNTER — Ambulatory Visit: Payer: 59 | Admitting: Nurse Practitioner

## 2020-07-27 ENCOUNTER — Other Ambulatory Visit: Payer: Self-pay | Admitting: Nurse Practitioner

## 2020-07-27 DIAGNOSIS — Z8616 Personal history of COVID-19: Secondary | ICD-10-CM

## 2020-08-22 ENCOUNTER — Other Ambulatory Visit: Payer: Self-pay | Admitting: Oncology

## 2020-08-22 ENCOUNTER — Other Ambulatory Visit: Payer: Self-pay

## 2020-08-22 ENCOUNTER — Ambulatory Visit
Admission: RE | Admit: 2020-08-22 | Discharge: 2020-08-22 | Disposition: A | Discharge status: Home / Self Care | Payer: 59 | Source: Ambulatory Visit | Attending: Oncology | Admitting: Oncology

## 2020-08-22 DIAGNOSIS — Z853 Personal history of malignant neoplasm of breast: Secondary | ICD-10-CM

## 2020-08-22 DIAGNOSIS — Z1231 Encounter for screening mammogram for malignant neoplasm of breast: Secondary | ICD-10-CM

## 2020-09-27 ENCOUNTER — Ambulatory Visit
Admission: RE | Admit: 2020-09-27 | Discharge: 2020-09-27 | Disposition: A | Discharge status: Home / Self Care | Payer: 59 | Source: Ambulatory Visit | Attending: Oncology | Admitting: Oncology

## 2020-09-27 ENCOUNTER — Other Ambulatory Visit: Payer: Self-pay

## 2020-09-27 DIAGNOSIS — Z853 Personal history of malignant neoplasm of breast: Secondary | ICD-10-CM

## 2020-10-13 ENCOUNTER — Inpatient Hospital Stay: Payer: 59 | Attending: Oncology | Admitting: Oncology

## 2020-10-13 ENCOUNTER — Other Ambulatory Visit: Payer: Self-pay

## 2020-10-13 VITALS — BP 146/76 | HR 75 | Temp 98.1°F | Resp 16 | Ht 59.0 in | Wt 135.5 lb

## 2020-10-13 DIAGNOSIS — K219 Gastro-esophageal reflux disease without esophagitis: Secondary | ICD-10-CM | POA: Insufficient documentation

## 2020-10-13 DIAGNOSIS — Z923 Personal history of irradiation: Secondary | ICD-10-CM | POA: Diagnosis not present

## 2020-10-13 DIAGNOSIS — Z170 Estrogen receptor positive status [ER+]: Secondary | ICD-10-CM | POA: Insufficient documentation

## 2020-10-13 DIAGNOSIS — E785 Hyperlipidemia, unspecified: Secondary | ICD-10-CM | POA: Diagnosis not present

## 2020-10-13 DIAGNOSIS — C50211 Malignant neoplasm of upper-inner quadrant of right female breast: Secondary | ICD-10-CM | POA: Insufficient documentation

## 2020-10-13 DIAGNOSIS — Z79810 Long term (current) use of selective estrogen receptor modulators (SERMs): Secondary | ICD-10-CM | POA: Insufficient documentation

## 2020-10-13 NOTE — Progress Notes (Signed)
[place]  [phone] Fax:([phone]) [phone]     ID: [name] DOB: 09-20-1965  MR#: [id number]  YYT#:[id number]  Patient Care Team: Antony Blackbird, MD (Inactive) as PCP - General (Family Medicine) Alphonsa Overall, MD as Consulting Physician (General Surgery) Ervin Hensley, Virgie Dad, MD as Consulting Physician (Oncology) Sylvan Cheese, NP as Nurse Practitioner (Hematology and Oncology) Everlene Farrier, MD as Consulting Physician (Obstetrics and Gynecology) Gery Pray, MD as Consulting Physician (Radiation Oncology) OTHER MD:  CHIEF COMPLAINT: Estrogen receptor positive breast cancer  CURRENT TREATMENT: Completing 5 years of tamoxifen   INTERVAL HISTORY: [name] returns today for a follow-up of her estrogen receptor positive breast cancer.  This is her "graduation" visit.  She continues on tamoxifen.  She tolerates this well, with no unusual side effects.  She has about a 53-monthsupply left.  Since her last visit, she underwent bilateral diagnostic [facility] showing: breast density category C; no evidence of malignancy in either breast.   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  She finds this continues proximities stressful.  Also her son who is going to be a senior in high school was doing well in school and is very smart she says but then with COVID developed poor study habits and she is worried about him.  Ensure she has normal life stresses.  She is not able to exercise as much as she would like.  A detailed review of systems was otherwise noncontributory  BREAST CANCER HISTORY: From the original intake note:  "VKathaleen Maser [facility]. This suggested a possible asymmetry in the right breast. On 02/23/2015 the patient underwent right diagnostic mammography with  tomosynthesis and right breast ultrasonography. The breast density was category C. In the upper right breast there was an area of distortion which was palpable at approximately 12:30 o'clock. Ultrasound found an ill-defined mass measuring 8 mm. Ultrasound of the right axilla was benign.  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  Her subsequent history is as detailed below   PAST MEDICAL HISTORY: Past Medical History:  Diagnosis Date   Allergic rhinitis    hay fever   [facility]    [facility] [place]inner [place] [facility] [place] [facility] [place]   Food allergy    carrots, celery, kiwi, apple, peanuts   GERD (gastroesophageal reflux disease)    Headache(784.0)    Hyperlipidemia    Personal history of radiation therapy 2017    PAST SURGICAL HISTORY: Past Surgical History:  Procedure Laterality Date   BREAST LUMPECTOMY Right 06/15/2015   CESAREAN SECTION  2000,2004   x 2   RADIOACTIVE SEED GUIDED PARTIAL MASTECTOMY WITH AXILLARY SENTINEL LYMPH NODE BIOPSY Right 06/27/2015   Procedure: RADIOACTIVE SEED GUIDED PARTIAL MASTECTOMY WITH AXILLARY SENTINEL LYMPH NODE BIOPSY;  Surgeon: DAlphonsa Overall MD;  [facility]  Service: General;  Laterality: Right;   RADIOACTIVE SEED GUIDED PARTIAL MASTECTOMY/AXILLARY SENTINEL NODE BIOPSY/AXILLARY NODE DISSECTION Right 06/27/2015   Procedure: RADIOACTIVE SEED GUIDED PARTIAL MASTECTOMY WITH AXILLARY SENTINEL LYMPH NODE BIOPSY AND AXILLARY LYMPH NODE DISSECTION;  Surgeon: DShanon Brow  Lucia Gaskins, MD;  Location: [facility];  Service: General;  Laterality: Right;    FAMILY HISTORY Family History  Problem Relation Age of  Onset   Diabetes Mother    Hyperlipidemia Mother    Breast cancer Maternal Aunt        >50  As of March 2017 the patient's parents are still living, her father age 65 and her mother age 30. The patient has 2 brothers and 2 sisters. One maternal cousin was diagnosed with breast cancer in her late 24s.   GYNECOLOGIC HISTORY:  Patient's last menstrual period was 11/25/2017. Menarche age 80, first live birth age 14, which the patient understands increases the risk of breast cancer. She is GX P2. She stopped having periods in December 2016 and used hormone replacement for approximately 2 months. She used oral contraceptives remotely for approximately 10 years.   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 She works at [employer] with her husband. The patient is originally from the [place]. Her husband [name] is originally from [place]. Their daughter Mechele Claude and son Shea Stakes are currently 72 and 64, respectively. The daughter graduated from [employer] and is now helping in the family business.    ADVANCED DIRECTIVES: In the absence of any documents to the contrary the patient's husband is her healthcare     HEALTH MAINTENANCE: Social History   Tobacco Use   Smoking status: Never   Smokeless tobacco: Never  Substance Use Topics   Alcohol use: No   Drug use: No     No Known Allergies  Current Outpatient Medications  Medication Sig Dispense Refill   Cyanocobalamin (VITAMIN B 12 PO) Take 1 tablet by mouth daily.     famotidine (PEPCID) 20 MG tablet TAKE 1 TABLET(20 MG) BY MOUTH TWICE DAILY 60 tablet 0   gabapentin (NEURONTIN) 300 MG capsule Take 1 capsule (300 mg total) by mouth at bedtime. 90 capsule 4   montelukast (SINGULAIR) 10 MG tablet Take 1 tablet (10 mg total) by mouth at bedtime. 30 tablet 3   Omega 3 1000 MG CAPS Take by mouth. Reported on 09/20/2015     tamoxifen (NOLVADEX)  20 MG tablet TAKE 1 TABLET(20 MG) BY MOUTH DAILY 90 tablet 0   Vitamin D, Cholecalciferol, 1000 units CAPS Take by mouth.     No current facility-administered medications for this visit.    OBJECTIVE: Filipino-American woman who appears younger than stated age   1:   10/13/20 1556  BP: (!) 146/76  Pulse: 75  Resp: 16  Temp: 98.1 F (36.7 C)  SpO2: 99%     Body mass index is 27.37 kg/m.   ECOG FS:1 - Symptomatic but completely ambulatory  Sclerae unicteric, EOMs intact Wearing a mask No cervical or supraclavicular adenopathy Lungs no rales or rhonchi Heart regular rate and rhythm  Abd soft, nontender, positive bowel sounds MSK no focal spinal tenderness, no upper extremity lymphedema Neuro: nonfocal, well oriented, appropriate affect Breasts: The right breast is status post lumpectomy and radiation.  There is no evidence of disease recurrence.  The left breast and both axillae are benign.  LAB RESULTS:  CMP     Component Value Date/Time   NA 139 10/13/2019 1446   NA 144 08/15/2016 1008   K 4.1 10/13/2019 1446   K 3.9 08/15/2016 1008   CL 106 10/13/2019 1446   CO2 25 10/13/2019 1446   CO2 25  08/15/2016 1008   GLUCOSE 118 (H) 10/13/2019 1446   GLUCOSE 99 08/15/2016 1008   BUN 18 10/13/2019 1446   BUN 18.0 08/15/2016 1008   CREATININE 0.79 10/13/2019 1446   CREATININE 0.8 08/15/2016 1008   CALCIUM 9.5 10/13/2019 1446   CALCIUM 9.0 08/15/2016 1008   PROT 7.7 10/13/2019 1446   PROT 7.0 08/15/2016 1008   ALBUMIN 4.0 10/13/2019 1446   ALBUMIN 3.7 08/15/2016 1008   AST 23 10/13/2019 1446   AST 18 08/15/2016 1008   ALT 14 10/13/2019 1446   ALT 10 08/15/2016 1008   ALKPHOS 55 10/13/2019 1446   ALKPHOS 34 (L) 08/15/2016 1008   BILITOT 0.3 10/13/2019 1446   BILITOT 0.48 08/15/2016 1008   GFRNONAA >60 10/13/2019 1446   GFRAA >60 10/13/2019 1446    INo results found for: SPEP, UPEP  Lab Results  Component Value Date   WBC 5.8 10/13/2019   NEUTROABS 2.6  10/13/2019   HGB 12.7 10/13/2019   HCT 37.9 10/13/2019   MCV 91.3 10/13/2019   PLT 268 10/13/2019      Chemistry      Component Value Date/Time   NA 139 10/13/2019 1446   NA 144 08/15/2016 1008   K 4.1 10/13/2019 1446   K 3.9 08/15/2016 1008   CL 106 10/13/2019 1446   CO2 25 10/13/2019 1446   CO2 25 08/15/2016 1008   BUN 18 10/13/2019 1446   BUN 18.0 08/15/2016 1008   CREATININE 0.79 10/13/2019 1446   CREATININE 0.8 08/15/2016 1008      Component Value Date/Time   CALCIUM 9.5 10/13/2019 1446   CALCIUM 9.0 08/15/2016 1008   ALKPHOS 55 10/13/2019 1446   ALKPHOS 34 (L) 08/15/2016 1008   AST 23 10/13/2019 1446   AST 18 08/15/2016 1008   ALT 14 10/13/2019 1446   ALT 10 08/15/2016 1008   BILITOT 0.3 10/13/2019 1446   BILITOT 0.48 08/15/2016 1008       No results found for: LABCA2  No components found for: LABCA125  No results for input(s): INR in the last 168 hours.  Urinalysis    Component Value Date/Time   COLORURINE YELLOW 01/02/2007 1436   APPEARANCEUR Clear 01/02/2007 1436   LABSPEC > OR = 1.030 01/02/2007 1436   PHURINE 5.5 01/02/2007 1436   GLUCOSEU NEGATIVE 01/02/2007 1436   BILIRUBINUR NEGATIVE 01/02/2007 1436   KETONESUR NEGATIVE 01/02/2007 1436   UROBILINOGEN 0.2 mg/dL 01/02/2007 1436   NITRITE Negative 01/02/2007 1436   LEUKOCYTESUR Negative 01/02/2007 1436   ELIGIBLE FOR AVAILABLE RESEARCH PROTOCOL: no   STUDIES: MM DIAG BREAST TOMO BILATERAL  Result Date: 09/27/2020 CLINICAL DATA:  55 year old who underwent malignant lumpectomy of the RIGHT breast in 2017 with adjuvant radiation therapy. Patient is currently undergoing hormonal chemoprevention with tamoxifen. Annual evaluation. EXAM: DIGITAL DIAGNOSTIC BILATERAL MAMMOGRAM WITH TOMOSYNTHESIS AND CAD TECHNIQUE: Bilateral digital diagnostic mammography and breast tomosynthesis was performed. The images were evaluated with computer-aided detection. COMPARISON:  Previous exam(s). ACR Breast Density  Category c: The breast tissue is heterogeneously dense, which may obscure small masses. FINDINGS: Full field CC and MLO views of both breasts and a spot magnification MLO view of the lumpectomy site in the RIGHT breast were obtained. RIGHT: No findings suspicious for malignancy. Stable post lumpectomy scar/architectural distortion in the upper breast at posterior depth. LEFT: No findings suspicious for malignancy. IMPRESSION: 1. No mammographic evidence of malignancy involving either breast. 2. Stable post lumpectomy changes involving the RIGHT breast. RECOMMENDATION: Per protocol, as  the patient is now 2 or more years status post lumpectomy, she may return to annual screening mammography in 1 year. However, given the history of breast cancer, the patient remains eligible for annual diagnostic mammography if preferred. I have discussed the findings and recommendations with the patient. If applicable, a reminder letter will be sent to the patient regarding the next appointment. BI-RADS CATEGORY  2: Benign. Electronically Signed   By: Evangeline Dakin M.D.   On: 09/27/2020 08:40    ASSESSMENT: 55 y.o. [place] woman status post right breast upper inner quadrant biopsy 04/29/2015 for a clinical T1b N0, stage IA  invasive ductal carcinoma, grade 2, estrogen and progesterone receptor positive, HER-2 nonamplified, with an MIB-1 of 5%.  (1) status post right lumpectomy and sentinel lymph node sampling 06/27/2015 for a pT2 pN0, stage IIA invasive lobular carcinoma, grade 1, with negative margins, again HER-2 negative  (2) Oncotype DX score of 10 predicts an outside the breast risk of recurrence within 10 years of 7% if the patient's only systemic treatment is tamoxifen for 5 years. It also predicts no significant benefit from chemotherapy  (3) adjuvant radiation 08/11/2015-09/30/2015:   (1) Right Breast: 50.4 Gy in 28 fractions.              (2) Right Breast Boost: 10 Gy in 5 fractions.    (4) tamoxifen started  11/25/2015, completing five years August 2022   PLAN: Zack Seal is now 5-1/2 years out from definitive surgery for her breast cancer with no evidence of disease recurrence.  This is very favorable.  She can complete tamoxifen this month but since she has a 53-monthsupply she is simply going to continue that until she runs out.  She understands she does not need to "taper off".  At this point I feel comfortable releasing her to her primary care physicians.  All she will need in terms of breast cancer follow-up is her yearly screening mammography and a yearly physician breast exam  I will be glad to see Vongie again at any point in the future if and when the need arises but as of now are making no further routine appointments for her here.  Total encounter time 25 minutes.*   Alyson Ki, GVirgie Dad MD  10/13/20 6:26 PM [facility] [place] [id number]. [phone]   Fax. [phone]  I, KWilburn Mylar am acting as scribe for Dr. GVirgie Dad Hero Kulish.  I, GLurline DelMD, have reviewed the above documentation for accuracy and completeness, and I agree with the above.   *Total Encounter Time as defined by the Centers for Medicare and Medicaid Services includes, in addition to the face-to-face time of a patient visit (documented in the note above) non-face-to-face time: obtaining and reviewing outside history, ordering and reviewing medications, tests or procedures, care coordination (communications with other health care professionals or caregivers) and documentation in the medical record.

## 2020-12-24 ENCOUNTER — Other Ambulatory Visit: Payer: Self-pay | Admitting: Oncology

## 2021-06-28 DIAGNOSIS — E538 Deficiency of other specified B group vitamins: Secondary | ICD-10-CM | POA: Diagnosis not present

## 2021-06-28 DIAGNOSIS — Z13220 Encounter for screening for lipoid disorders: Secondary | ICD-10-CM | POA: Diagnosis not present

## 2021-06-28 DIAGNOSIS — E559 Vitamin D deficiency, unspecified: Secondary | ICD-10-CM | POA: Diagnosis not present

## 2021-06-28 DIAGNOSIS — Z0000 Encounter for general adult medical examination without abnormal findings: Secondary | ICD-10-CM | POA: Diagnosis not present

## 2021-07-07 DIAGNOSIS — Z1211 Encounter for screening for malignant neoplasm of colon: Secondary | ICD-10-CM | POA: Diagnosis not present

## 2021-07-07 DIAGNOSIS — K5730 Diverticulosis of large intestine without perforation or abscess without bleeding: Secondary | ICD-10-CM | POA: Diagnosis not present

## 2021-07-07 DIAGNOSIS — K649 Unspecified hemorrhoids: Secondary | ICD-10-CM | POA: Diagnosis not present

## 2021-12-11 ENCOUNTER — Other Ambulatory Visit: Payer: Self-pay | Admitting: Family Medicine

## 2021-12-11 DIAGNOSIS — Z853 Personal history of malignant neoplasm of breast: Secondary | ICD-10-CM

## 2022-01-22 ENCOUNTER — Ambulatory Visit
Admission: RE | Admit: 2022-01-22 | Discharge: 2022-01-22 | Disposition: A | Discharge status: Home / Self Care | Payer: 59 | Source: Ambulatory Visit | Attending: Family Medicine | Admitting: Family Medicine

## 2022-01-22 DIAGNOSIS — Z853 Personal history of malignant neoplasm of breast: Secondary | ICD-10-CM

## 2022-12-25 ENCOUNTER — Other Ambulatory Visit: Payer: Self-pay | Admitting: Family Medicine

## 2022-12-25 DIAGNOSIS — Z1231 Encounter for screening mammogram for malignant neoplasm of breast: Secondary | ICD-10-CM

## 2023-01-24 ENCOUNTER — Ambulatory Visit
Admission: RE | Admit: 2023-01-24 | Discharge: 2023-01-24 | Disposition: A | Discharge status: Home / Self Care | Payer: 59 | Source: Ambulatory Visit | Attending: Family Medicine | Admitting: Family Medicine

## 2023-01-24 DIAGNOSIS — Z1231 Encounter for screening mammogram for malignant neoplasm of breast: Secondary | ICD-10-CM
